# Patient Record
Sex: Male | Born: 1960
Health system: Southern US, Community
[De-identification: ages and names within clinical notes are randomized; demographics above are authoritative.]

## PROBLEM LIST (undated history)

## (undated) DIAGNOSIS — H9190 Unspecified hearing loss, unspecified ear: Secondary | ICD-10-CM

## (undated) DIAGNOSIS — N400 Enlarged prostate without lower urinary tract symptoms: Secondary | ICD-10-CM

## (undated) DIAGNOSIS — E7800 Pure hypercholesterolemia, unspecified: Secondary | ICD-10-CM

## (undated) HISTORY — DX: Pure hypercholesterolemia, unspecified: E78.00

---

## 2008-09-16 ENCOUNTER — Ambulatory Visit: Payer: Self-pay | Admitting: Cardiology

## 2008-11-18 ENCOUNTER — Ambulatory Visit: Payer: Self-pay | Admitting: Cardiology

## 2008-11-18 LAB — CONVERTED CEMR LAB
ALT: 17 units/L (ref 0–53)
Albumin: 3.8 g/dL (ref 3.5–5.2)
Alkaline Phosphatase: 52 units/L (ref 39–117)
CO2: 27 meq/L (ref 19–32)
Chloride: 110 meq/L (ref 96–112)
Glucose, Bld: 80 mg/dL (ref 70–99)
HDL: 26.6 mg/dL — ABNORMAL LOW (ref >39.00)
Potassium: 4.5 meq/L (ref 3.5–5.1)
Sodium: 143 meq/L (ref 135–145)
Total Protein: 6.4 g/dL (ref 6.0–8.3)

## 2009-01-03 ENCOUNTER — Ambulatory Visit: Payer: Self-pay | Admitting: Cardiology

## 2009-01-03 DIAGNOSIS — E7800 Pure hypercholesterolemia, unspecified: Secondary | ICD-10-CM | POA: Insufficient documentation

## 2009-01-10 ENCOUNTER — Telehealth: Payer: Self-pay | Admitting: Cardiology

## 2009-04-05 ENCOUNTER — Ambulatory Visit: Payer: Self-pay | Admitting: Cardiology

## 2009-04-06 LAB — CONVERTED CEMR LAB
ALT: 15 units/L (ref 0–53)
AST: 17 units/L (ref 0–37)
Albumin: 3.6 g/dL (ref 3.5–5.2)
Alkaline Phosphatase: 46 units/L (ref 39–117)
Bilirubin, Direct: 0.1 mg/dL (ref 0.0–0.3)
Total CHOL/HDL Ratio: 6
Total Protein: 6 g/dL (ref 6.0–8.3)

## 2009-04-07 ENCOUNTER — Telehealth: Payer: Self-pay | Admitting: Cardiology

## 2009-04-11 LAB — CONVERTED CEMR LAB
ALT: 25 units/L (ref 0–53)
Albumin: 4 g/dL (ref 3.5–5.2)
Cholesterol: 113 mg/dL (ref 0–200)
LDL Cholesterol: 75 mg/dL (ref 0–99)
Total Protein: 6.7 g/dL (ref 6.0–8.3)
Triglycerides: 42 mg/dL (ref 0.0–149.0)
VLDL: 8.4 mg/dL (ref 0.0–40.0)

## 2009-08-30 ENCOUNTER — Ambulatory Visit: Payer: Self-pay | Admitting: Cardiology

## 2009-08-31 LAB — CONVERTED CEMR LAB
Albumin: 3.7 g/dL (ref 3.5–5.2)
Alkaline Phosphatase: 47 units/L (ref 39–117)
Bilirubin, Direct: 0 mg/dL (ref 0.0–0.3)
HDL: 31.2 mg/dL — ABNORMAL LOW (ref >39.00)
Total CHOL/HDL Ratio: 3
Total Protein: 6.2 g/dL (ref 6.0–8.3)
Triglycerides: 44 mg/dL (ref 0.0–149.0)
VLDL: 8.8 mg/dL (ref 0.0–40.0)

## 2009-09-01 ENCOUNTER — Telehealth: Payer: Self-pay | Admitting: Cardiology

## 2009-11-20 ENCOUNTER — Telehealth: Payer: Self-pay | Admitting: Cardiology

## 2010-09-12 ENCOUNTER — Encounter: Payer: Self-pay | Admitting: Cardiology

## 2010-09-25 NOTE — Progress Notes (Signed)
Summary: returning call  Phone Note Call from Patient Call back at Work Phone [phone]   Caller: Patient Reason for Call: Talk to Nurse Summary of Call: returning call Initial call taken by: Migdalia Dk,  September 01, 2009 8:29 AM  Follow-up for Phone Call        PT HERE COPY OF LABS FIVEN AT PT"S REQUEST Follow-up by: Scherrie Bateman, LPN,  September 01, 2009 9:19 AM

## 2010-09-25 NOTE — Progress Notes (Signed)
Summary: pt needs a 90day supply w/ 1 refill  Phone Note Refill Request Call back at Work Phone [phone] Message from:  [facility] [place]  Refills Requested: Medication #1:  CRESTOR 20 MG TABS Take one tablet by mouth daily.. pt needs a 90day supply called in  Initial call taken by: Omer Jack,  November 20, 2009 2:35 PM  Follow-up for Phone Call        [facility] [place], [place] Follow-up by: Oswald Hillock,  November 21, 2009 10:06 AM    Prescriptions: CRESTOR 20 MG TABS (ROSUVASTATIN CALCIUM) Take one tablet by mouth daily.  #90 x 1   Entered by:   Oswald Hillock   Authorized by:   Gaylord Shih, MD, [facility]   Signed by:   Oswald Hillock on 11/21/2009   Method used:   [facility]  [place] [address]       [address]       [place], [place]  [address]       Ph: [phone] or [phone]       Fax: [phone]   RxID:   [id number]

## 2011-01-08 NOTE — Assessment & Plan Note (Signed)
[facility]                            CARDIOLOGY OFFICE NOTE   NAME:Wesley Wise                      MRN:          [id number]  DATE:09/16/2008                            DOB:          [birth year]    HISTORY OF PRESENT ILLNESS:  Wesley Wise is a 50 year old married  white male, husband of the patient of mine, who comes today because of  some concerns of having heart disease.   His risk factors include age greater than 26, tobacco use of  approximately 5 per day at present but over 30 years span, and a family  history of coronary artery disease.  His father had a heart attack at  age 76, but his brother had bypass at 31.   He does not know his lipid panel.  He is not hypertensive.  He does not  have diabetes.   His current exercise strategy allows him to do the bike and also some  other cardio activity without any chest discomfort or chest pressure.  He does get short of breath.  He denies orthopnea, PND, or peripheral  edema.  He has had no tachy palpitations.   PAST MEDICAL HISTORY:  He is not allergic to any medications.  He does  not use recreational drugs.  He rarely drinks alcohol.  He smokes about  5-10 cigarettes a day.  He does drink a lot of caffeine.   SURGICAL HISTORY:  None.   FAMILY HISTORY:  As above.   SOCIAL HISTORY:  He is married and has 3 children.  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  He works in [employer] and does a fair  amount of manual work and lifting.   REVIEW OF SYSTEMS:  All systems reviewed and are negative.  He  specifically denies any chest discomfort, difficulty swallowing, reflux,  abdominal pain, and no change in bowel habits.   PHYSICAL EXAMINATION:  GENERAL:  He is extremely pleasant gentleman.  VITAL SIGNS:  He is 6 feet tall and weighs 230.  His blood pressure is  105/73 and his pulse is 56 and regular.   His electrocardiogram is essentially normal, except for poor R-wave  progression across the anterior precordium.  He has a left axis  deviation.  HEENT:  He has a mustache.  Dentition, oral mucosa is normal.  Otherwise, normal HEENT.  NECK:  Supple.  Carotids upstrokes are equal bilaterally without bruits,  no JVD.  Thyroid is not enlarged.  Trachea is midline.  LUNGS:  Clear to auscultation and percussion.  HEART:  A nondisplaced PMI.  Normal S1 and S2.  ABDOMEN:  Soft, good bowel sounds.  He has got mild abdominal  protuberance.  There is no midline bruit.  No pulsatile mass.  EXTREMITIES:  No cyanosis, clubbing, or edema.  Pulses are intact.  NEURO:  Intact.  SKIN:  Unremarkable.  MUSCULOSKELETAL:  No significant joint deformities or effusions.   ASSESSMENT:  Mr. Wesley Wise has several cardiac risk factors for premature  coronary artery disease.  He does not know his lipid status.  He is  currently having  some dyspnea on exertion.   PLAN:  1. Exercise treadmill.  2. Fasting lipids and a comprehensive metabolic panel.  I would have a      low threshold for treating him with 81 mg of aspirin per day as      well as a statin.  We will review when he returns.      Thomas C. Daleen Squibb, MD, [facility]  Electronically Signed    TCW/MedQ  DD: 09/16/2008  DT: 09/16/2008  Job #: [id number]   cc:   Teena Irani. Arlyce Dice, M.D.

## 2011-05-10 ENCOUNTER — Other Ambulatory Visit: Payer: Self-pay | Admitting: Cardiology

## 2011-06-19 ENCOUNTER — Other Ambulatory Visit: Payer: Self-pay | Admitting: Family Medicine

## 2011-06-19 ENCOUNTER — Ambulatory Visit
Admission: RE | Admit: 2011-06-19 | Discharge: 2011-06-19 | Disposition: A | Discharge status: Home / Self Care | Payer: BC Managed Care – PPO | Source: Ambulatory Visit | Attending: Family Medicine | Admitting: Family Medicine

## 2011-06-19 DIAGNOSIS — R1909 Other intra-abdominal and pelvic swelling, mass and lump: Secondary | ICD-10-CM

## 2012-05-04 ENCOUNTER — Other Ambulatory Visit: Payer: Self-pay | Admitting: Cardiology

## 2012-05-05 NOTE — Telephone Encounter (Signed)
I spoke with Wesley Wise sch for Dr Wesley Wise she is going to make pt an ov. I spoke with pt he is in agreement and is awaiting a call. Give pt three refill so he can have time to get into the ov Wesley Wise

## 2012-07-27 ENCOUNTER — Encounter: Payer: Self-pay | Admitting: *Deleted

## 2012-07-29 ENCOUNTER — Ambulatory Visit (INDEPENDENT_AMBULATORY_CARE_PROVIDER_SITE_OTHER): Payer: BC Managed Care – PPO | Admitting: Cardiology

## 2012-07-29 ENCOUNTER — Encounter: Payer: Self-pay | Admitting: Cardiology

## 2012-07-29 VITALS — BP 123/78 | HR 64 | Ht 72.0 in | Wt 247.0 lb

## 2012-07-29 DIAGNOSIS — E7800 Pure hypercholesterolemia, unspecified: Secondary | ICD-10-CM

## 2012-07-29 MED ORDER — ROSUVASTATIN CALCIUM 20 MG PO TABS: 20.0000 mg | ORAL_TABLET | Freq: Every day | ORAL | Status: DC

## 2012-07-29 NOTE — Patient Instructions (Addendum)
Your Crestor has been refilled  Please make sure you have your cholesterol and lfts checked before Christmas at [facility].  Your physician recommends that you continue on your current medications as directed. Please refer to the Current Medication list given to you today.

## 2012-07-29 NOTE — Progress Notes (Signed)
HPI Wesley Wise returns today for evaluation and management of his history of hyperlipidemia.  He has not had his blood work checked in almost 2 years. Last was at [facility] with [facility]. We have not seen him in several years.  He says he has 3 more weeks of Crestor left!  He denies any symptoms of angina or ischemic heart vascular disease.  Past Medical History  Diagnosis Date  . Pure hypercholesterolemia     Current Outpatient Prescriptions  Medication Sig Dispense Refill  . aspirin 81 MG tablet Take 81 mg by mouth daily.      . Multiple Vitamin (MULTIVITAMIN) tablet Take 1 tablet by mouth daily.      . rosuvastatin (CRESTOR) 20 MG tablet Take 1 tablet (20 mg total) by mouth at bedtime.  90 tablet  3  . [DISCONTINUED] CRESTOR 20 MG tablet TAKE ONE TABLET BY MOUTH DAILY.  90 tablet  0    No Known Allergies  Family History  Problem Relation Age of Onset  . CAD      family history    History   Social History  . Marital Status: Married    Spouse Name: N/A    Number of Children: N/A  . Years of Education: N/A   Occupational History  . Not on file.   Social History Main Topics  . Smoking status: Former Games developer  . Smokeless tobacco: Never Used  . Alcohol Use: Yes  . Drug Use: No  . Sexually Active: Not on file   Other Topics Concern  . Not on file   Social History Narrative  . No narrative on file    ROS ALL NEGATIVE EXCEPT THOSE NOTED IN HPI  PE  General Appearance: well developed, well nourished in no acute distress, muscular HEENT: symmetrical face, PERRLA, good dentition  Neck: no JVD, thyromegaly, or adenopathy, trachea midline Chest: symmetric without deformity Cardiac: PMI non-displaced, RRR, normal S1, S2, no gallop or murmur Lung: clear to ausculation and percussion Vascular: all pulses full without bruits  Abdominal: nondistended, nontender, good bowel sounds, no HSM, no bruits Extremities: no cyanosis, clubbing or edema, no sign of DVT, no  varicosities  Skin: normal color, no rashes Neuro: alert and oriented x 3, non-focal Pysch: normal affect  EKG  Normal sinus rhythm, normal EKG  BMET    Component Value Date/Time   NA 143 11/18/2008 1157   K 4.5 11/18/2008 1157   CL 110 11/18/2008 1157   CO2 27 11/18/2008 1157   GLUCOSE 80 11/18/2008 1157   BUN 12 11/18/2008 1157   CREATININE 1.1 11/18/2008 1157   CALCIUM 9.0 11/18/2008 1157   GFRNONAA 76.03 11/18/2008 1157    Lipid Panel     Component Value Date/Time   CHOL 108 08/30/2009 0905   TRIG 44.0 08/30/2009 0905   HDL 31.20* 08/30/2009 0905   CHOLHDL 3 08/30/2009 0905   VLDL 8.8 08/30/2009 0905   LDLCALC 68 08/30/2009 0905    CBC No results found for this basename: wbc, rbc, hgb, hct, plt, mcv, mch, mchc, rdw, neutrabs, lymphsabs, monoabs, eosabs, basosabs

## 2012-07-29 NOTE — Assessment & Plan Note (Signed)
I've advised him to go to his primary care physician office in [place] and have  complete blood work done before Christmas. He then needs her regular physical. I have renewed his Crestor so he does not run out. I'll see him back on a when necessary basis.

## 2012-08-05 ENCOUNTER — Telehealth: Payer: Self-pay | Admitting: Cardiology

## 2012-08-05 NOTE — Telephone Encounter (Signed)
I spoke with pt wife about lab work. She stated he had appt with pcp for lab draw but they called and said he could not have it drawn b/c it was not ordered by their pcp.  OV note from Dr. Daleen Squibb faxed to pcp. Will call office and ask about pt having lab work.  Mylo Red RN

## 2012-08-05 NOTE — Telephone Encounter (Signed)
I had called pt pcp office and spoke with Debbie. She states pt has his DOT appointment tomorrow and they have him listed for fasting lab work. Pt called back to our office at the same time & is aware of fasting lab work for tomorrow at his pcp. Mylo Red RN

## 2012-08-05 NOTE — Telephone Encounter (Signed)
New problem:    Need an order for lab work drawn for tomorrow.

## 2013-08-11 ENCOUNTER — Other Ambulatory Visit: Payer: Self-pay | Admitting: Cardiology

## 2013-11-09 ENCOUNTER — Other Ambulatory Visit: Payer: Self-pay

## 2013-11-09 MED ORDER — ROSUVASTATIN CALCIUM 20 MG PO TABS: ORAL_TABLET | ORAL | Status: DC

## 2014-09-02 ENCOUNTER — Ambulatory Visit (INDEPENDENT_AMBULATORY_CARE_PROVIDER_SITE_OTHER): Payer: BC Managed Care – PPO | Admitting: Cardiology

## 2014-09-02 ENCOUNTER — Encounter: Payer: Self-pay | Admitting: Cardiology

## 2014-09-02 VITALS — BP 112/72 | HR 67 | Ht 72.0 in | Wt 249.8 lb

## 2014-09-02 DIAGNOSIS — E7800 Pure hypercholesterolemia, unspecified: Secondary | ICD-10-CM

## 2014-09-02 DIAGNOSIS — Z8249 Family history of ischemic heart disease and other diseases of the circulatory system: Secondary | ICD-10-CM

## 2014-09-02 LAB — COMPREHENSIVE METABOLIC PANEL
ALT: 32 U/L (ref 0–53)
AST: 22 U/L (ref 0–37)
Albumin: 4.3 g/dL (ref 3.5–5.2)
Alkaline Phosphatase: 53 U/L (ref 39–117)
BUN: 18 mg/dL (ref 6–23)
CO2: 24 mEq/L (ref 19–32)
Calcium: 9 mg/dL (ref 8.4–10.5)
Chloride: 108 mEq/L (ref 96–112)
Creatinine, Ser: 1 mg/dL (ref 0.4–1.5)
GFR: 83.88 mL/min (ref >60.00)
Glucose, Bld: 91 mg/dL (ref 70–99)
Potassium: 3.8 mEq/L (ref 3.5–5.1)
Sodium: 139 mEq/L (ref 135–145)
Total Bilirubin: 0.9 mg/dL (ref 0.2–1.2)
Total Protein: 7 g/dL (ref 6.0–8.3)

## 2014-09-02 NOTE — Progress Notes (Signed)
Patient ID: SRIKAR CHIANG, male   DOB: [birth year], 54 y.o.   MRN: [id number]    Patient Name: Wesley Wise Date of Encounter: 09/02/2014  Primary Care Provider:  No primary care provider on file. Primary Cardiologist:  Dorothy Spark  Problem List   Past Medical History  Diagnosis Date  . Pure hypercholesterolemia    No past surgical history on file.  Allergies  No Known Allergies  HPI  Wesley Wise is a pleasant 54 year old gentleman who was previously followed by Dr. Verl Blalock for hyperlipidemia and family history of premature coronary artery disease. Patient was previously diagnosed with high cholesterol and started on Crestor 10 mg daily that he was taking for a few years, however ran out and didn't have any refills, stopped in March 2015. Last cholesterol check we have in our system showed HDL 31, LDL 68 and triglycerides 44. This was while he wasl still taking his Crestor. Patient states that he is not exceptionally active but can certainly walk a few miles without any symptoms of chest pain, shortness of breath, palpitations or syncope. He also denies orthopnea, paroxysmal nocturnal dyspnea, claudication. He states that 3 of his brothers had myocardial infarction in their 24s and 60s as well as one of his sisters.   Home Medications  Prior to Admission medications   Medication Sig Start Date End Date Taking? Authorizing Provider  aspirin 81 MG tablet Take 81 mg by mouth daily.   Yes Historical Provider, MD  Multiple Vitamin (MULTIVITAMIN) tablet Take 1 tablet by mouth daily.   Yes Historical Provider, MD    Family History  Family History  Problem Relation Age of Onset  . CAD      family history    Social History  History   Social History  . Marital Status: Married    Spouse Name: N/A    Number of Children: N/A  . Years of Education: N/A   Occupational History  . Not on file.   Social History Main Topics  . Smoking status: Former Research scientist (life sciences)  . Smokeless  tobacco: Never Used  . Alcohol Use: Yes  . Drug Use: No  . Sexual Activity: Not on file   Other Topics Concern  . Not on file   Social History Narrative     Review of Systems, as per HPI, otherwise negative General:  No chills, fever, night sweats or weight changes.  Cardiovascular:  No chest pain, dyspnea on exertion, edema, orthopnea, palpitations, paroxysmal nocturnal dyspnea. Dermatological: No rash, lesions/masses Respiratory: No cough, dyspnea Urologic: No hematuria, dysuria Abdominal:   No nausea, vomiting, diarrhea, bright red blood per rectum, melena, or hematemesis Neurologic:  No visual changes, wkns, changes in mental status. All other systems reviewed and are otherwise negative except as noted above.  Physical Exam  Blood pressure 112/72, pulse 67, height 6' (1.829 m), weight 249 lb 12.8 oz (113.309 kg).  General: Pleasant, NAD Psych: Normal affect. Neuro: Alert and oriented X 3. Moves all extremities spontaneously. HEENT: Normal  Neck: Supple without bruits or JVD. Lungs:  Resp regular and unlabored, CTA. Heart: RRR no s3, s4, or murmurs. Abdomen: Soft, non-tender, non-distended, BS + x 4.  Extremities: No clubbing, cyanosis or edema. DP/PT/Radials 2+ and equal bilaterally.  Labs:  No results for input(s): CKTOTAL, CKMB, TROPONINI in the last 72 hours. No results found for: WBC, HGB, HCT, MCV, PLT  No results found for: DDIMER Invalid input(s): POCBNP    Component Value Date/Time   NA 143  11/18/2008 1157   K 4.5 11/18/2008 1157   CL 110 11/18/2008 1157   CO2 27 11/18/2008 1157   GLUCOSE 80 11/18/2008 1157   BUN 12 11/18/2008 1157   CREATININE 1.1 11/18/2008 1157   CALCIUM 9.0 11/18/2008 1157   PROT 6.2 08/30/2009 0905   ALBUMIN 3.7 08/30/2009 0905   AST 22 08/30/2009 0905   ALT 18 08/30/2009 0905   ALKPHOS 47 08/30/2009 0905   BILITOT 0.7 08/30/2009 0905   GFRNONAA 76.03 11/18/2008 1157   Lab Results  Component Value Date   CHOL 108 08/30/2009    HDL 31.20* 08/30/2009   LDLCALC 68 08/30/2009   TRIG 44.0 08/30/2009    Accessory Clinical Findings  Echocardiogram - none  ECG - SR, LAD, LAFB, otherwise normal, unchanged from 2013.   Assessment & Plan  54 year old gentleman  1. Hyperlipidemia and significant family history of premature coronary artery disease with some of his siblings having heart attack without any warning. Patient is an excellent candidate for risk stratification with calcium scoring. His last lipid profile was excellent but this was while he was taking Crestor. We will check his NMR lipid profile today including lipoprotein a to evaluate for particle size and number as well as hereditary portion of his hyperlipidemia. Calcium score will risk risk stratify patient and we can potentially take him off cholesterol medicine or intensify his regimen based on the results.  We'll call patient with the results and afterwards follow in one year.    Dorothy Spark, MD, [facility] 09/02/2014, 2:43 PM

## 2014-09-02 NOTE — Patient Instructions (Signed)
Your physician recommends that you continue on your current medications as directed. Please refer to the Current Medication list given to you today.  Your physician recommends that you return for lab work today (NMR lipid profile, Lipoprotein A, CMP)  Your physician would like for you to get a Calcium Score Study.  There is an out of pocket charge of $150.  Your physician wants you to follow-up in: 1 year with Dr. Meda Coffee. You will receive a reminder letter in the mail two months in advance. If you don't receive a letter, please call our office to schedule the follow-up appointment.

## 2014-09-04 LAB — NMR LIPOPROFILE WITH LIPIDS
Cholesterol, Total: 161 mg/dL (ref 100–199)
HDL Particle Number: 21.7 umol/L — ABNORMAL LOW (ref >=30.5)
HDL Size: 8.6 nm — ABNORMAL LOW (ref >=9.2)
HDL-C: 28 mg/dL — ABNORMAL LOW (ref >39)
LDL (calc): 97 mg/dL (ref 0–99)
LDL Particle Number: 1559 nmol/L — ABNORMAL HIGH (ref <1000)
LDL Size: 20.5 nm (ref >=20.8)
LP-IR Score: 92 — ABNORMAL HIGH (ref <=45)
Large HDL-P: 1.8 umol/L — ABNORMAL LOW (ref >=4.8)
Large VLDL-P: 3.4 nmol/L — ABNORMAL HIGH (ref <=2.7)
Small LDL Particle Number: 712 nmol/L — ABNORMAL HIGH (ref <=527)
Triglycerides: 182 mg/dL — ABNORMAL HIGH (ref 0–149)
VLDL Size: 64.8 nm — ABNORMAL HIGH (ref <=46.6)

## 2014-09-05 LAB — LIPOPROTEIN A (LPA): Lipoprotein (a): 14 mg/dL (ref 0–30)

## 2014-10-12 ENCOUNTER — Ambulatory Visit (INDEPENDENT_AMBULATORY_CARE_PROVIDER_SITE_OTHER)
Admission: RE | Admit: 2014-10-12 | Discharge: 2014-10-12 | Disposition: A | Discharge status: Home / Self Care | Payer: Self-pay | Source: Ambulatory Visit | Attending: Cardiology | Admitting: Cardiology

## 2014-10-12 DIAGNOSIS — E7800 Pure hypercholesterolemia, unspecified: Secondary | ICD-10-CM

## 2014-10-12 DIAGNOSIS — Z8249 Family history of ischemic heart disease and other diseases of the circulatory system: Secondary | ICD-10-CM

## 2014-10-14 ENCOUNTER — Telehealth: Payer: Self-pay | Admitting: *Deleted

## 2014-10-14 DIAGNOSIS — E785 Hyperlipidemia, unspecified: Secondary | ICD-10-CM

## 2014-10-14 DIAGNOSIS — R931 Abnormal findings on diagnostic imaging of heart and coronary circulation: Secondary | ICD-10-CM

## 2014-10-14 DIAGNOSIS — E7800 Pure hypercholesterolemia, unspecified: Secondary | ICD-10-CM

## 2014-10-14 MED ORDER — ROSUVASTATIN CALCIUM 10 MG PO TABS
10.0000 mg | ORAL_TABLET | Freq: Every day | ORAL | Status: DC
Start: 2014-10-14 — End: 2015-10-09

## 2014-10-14 NOTE — Telephone Encounter (Signed)
Informed the pt that per Dr Meda Coffee his lipids are elevated, and she recommends he start back taking Crestor 10 mg po daily and come in for a CMET in one month.  Informed the pt that per Dr Meda Coffee his calcium score is quite high for his age, showing evidence of atherosclerotic coronary disease, and she recommends restarting the Crestor and schedule an exercise treadmill test to make sure there are no flow limiting lesions.  Confirmed the pharmacy of choice with the pt.  Pt requesting his lab appt be made the same day as his exercise myoview.  Informed the pt that someone from our scheduling dept will be contacting him to have both of these appts set up.  Informed the pt to inform the scheduler that he request both appts to be scheduled same day.  Pt verbalized understanding and agrees with this plan.

## 2014-10-14 NOTE — Telephone Encounter (Signed)
-----   Message from Dorothy Spark, MD sent at 10/13/2014  3:05 PM EST ----- Lipids are elevated, I would restart crestor 10 mg po daily and follow CMP in 1 month. Also his calcium score is quite high for his age, showing evidence of atherosclerotic coronary disease, therefore we need to restart crestor and schedule an exercise treadmill stress test to make sure there are no flow limiting lesions.

## 2014-10-28 ENCOUNTER — Encounter: Payer: Self-pay | Admitting: Cardiology

## 2014-11-16 ENCOUNTER — Ambulatory Visit (HOSPITAL_COMMUNITY): Payer: BLUE CROSS/BLUE SHIELD | Attending: Cardiology | Admitting: Radiology

## 2014-11-16 DIAGNOSIS — E785 Hyperlipidemia, unspecified: Secondary | ICD-10-CM | POA: Insufficient documentation

## 2014-11-16 DIAGNOSIS — E7800 Pure hypercholesterolemia, unspecified: Secondary | ICD-10-CM

## 2014-11-16 DIAGNOSIS — I2510 Atherosclerotic heart disease of native coronary artery without angina pectoris: Secondary | ICD-10-CM | POA: Insufficient documentation

## 2014-11-16 DIAGNOSIS — R931 Abnormal findings on diagnostic imaging of heart and coronary circulation: Secondary | ICD-10-CM

## 2014-11-16 MED ORDER — TECHNETIUM TC 99M SESTAMIBI GENERIC - CARDIOLITE
33.0000 | Freq: Once | INTRAVENOUS | Status: AC | PRN
  Administered 2014-11-16: 33 via INTRAVENOUS

## 2014-11-16 MED ORDER — TECHNETIUM TC 99M SESTAMIBI GENERIC - CARDIOLITE
11.0000 | Freq: Once | INTRAVENOUS | Status: AC | PRN
  Administered 2014-11-16: 11 via INTRAVENOUS

## 2014-11-16 NOTE — Progress Notes (Signed)
  [facility] [address] [place], [place] [address] [phone]    Cardiology Nuclear Med Study  Wesley Wise is a 54 y.o. male     MRN : [id number]     DOB: 09-Jul-1961  Procedure Date: 11/16/2014  Nuclear Med Background Indication for Stress Test:  Evaluation for Ischemia and CT scan: elevated coronary calcium score History:  No known CAD Cardiac Risk Factors: none  Symptoms:  None indicated   Nuclear Pre-Procedure Caffeine/Decaff Intake:  None NPO After: 7:00pm   Lungs:  clear O2 Sat: 96% on room air. IV 0.9% NS with Angio Cath:  22g  IV Site: R Hand  IV Started by:  Matilde Haymaker, RN  Chest Size (in):  44 Cup Size: n/a  Height: 6' (1.829 m)  Weight:  234 lb (106.142 kg)  BMI:  Body mass index is 31.73 kg/(m^2). Tech Comments:  n/a    Nuclear Med Study 1 or 2 day study: 1 day  Stress Test Type:  Stress  Reading MD: n/a  Order Authorizing Provider:  Filiberto Pinks  Resting Radionuclide: Technetium 59m Sestamibi  Resting Radionuclide Dose: 11.0 mCi   Stress Radionuclide:  Technetium 66m Sestamibi  Stress Radionuclide Dose: 33.0 mCi           Stress Protocol Rest HR: 56 Stress HR: 151  Rest BP: 106/72 Stress BP: 162/46  Exercise Time (min): 10:45 METS: 12.9   Predicted Max HR: 167 bpm % Max HR: 90.42 bpm Rate Pressure Product: 24462   Dose of Adenosine (mg):  n/a Dose of Lexiscan: n/a mg  Dose of Atropine (mg): n/a Dose of Dobutamine: n/a mcg/kg/min (at max HR)  Stress Test Technologist: Glade Lloyd, BS-ES  Nuclear Technologist:  Earl Many, CNMT     Rest Procedure:  Myocardial perfusion imaging was performed at rest 45 minutes following the intravenous administration of Technetium 20m Sestamibi. Rest ECG: NSR with non-specific ST-T wave changes. T-wave inversion noted in aVR.  Stress Procedure:  The patient exercised on the treadmill utilizing the Bruce Protocol for 10:45 minutes. The patient stopped due to  fatigue and denied any chest pain.  Technetium 7m Sestamibi was injected at peak exercise and myocardial perfusion imaging was performed after a brief delay. Stress ECG: No significant change from baseline ECG  QPS Raw Data Images:  Normal; no motion artifact; normal heart/lung ratio. Stress Images:  Normal homogeneous uptake in all areas of the myocardium. apical thinning noted at both rest and stress. Rest Images:  Normal homogeneous uptake in all areas of the myocardium. Subtraction (SDS):  No evidence of ischemia. Transient Ischemic Dilatation (Normal <1.22):  0.84 Lung/Heart Ratio (Normal <0.45):  0.33  Quantitative Gated Spect Images QGS EDV:  116 ml QGS ESV:  49 ml  Impression Exercise Capacity:  Good exercise capacity. BP Response:  Normal blood pressure response. Clinical Symptoms:  No significant symptoms noted. ECG Impression:  No significant ST segment change suggestive of ischemia. Comparison with Prior Nuclear Study: No previous nuclear study performed  Overall Impression:  Normal stress nuclear study. Low risk study with no evidence of ischemia. Good exercise tolerance, 10 minutes and 45 seconds. No EKG changes.  LV Ejection Fraction: 58%.  LV Wall Motion:  NL LV Function; NL Wall Motion  Candee Furbish, MD

## 2014-11-24 ENCOUNTER — Telehealth: Payer: Self-pay | Admitting: Cardiology

## 2014-11-24 NOTE — Telephone Encounter (Signed)
Contacted the pt back to inform him that per Dr Meda Coffee his nuclear stress test was normal.  Pt verbalized understanding.

## 2014-11-24 NOTE — Telephone Encounter (Signed)
New message ° ° ° ° °Want test results °

## 2015-05-06 IMAGING — CT CT HEART SCORING
1 of 3 series · 10 of 20 positions shown, 13 images · non-contrast
Comparison: No priors.

CLINICAL DATA: Risk stratification

EXAM:
Coronary Calcium Score
TECHNIQUE: The patient was scanned on a Siemens Sensation 16 slice scanner.
Axial non-contrast 3mm slices were carried out through the heart.
The data set was analyzed on a dedicated work station and scored
using the Agatson method.

[Series 6: st thins for reformat · axial · 0.80mm/px · z∈[-226,-137]mm · 10 of 109 slices shown, 13 images]
[im 10/109  vessel]
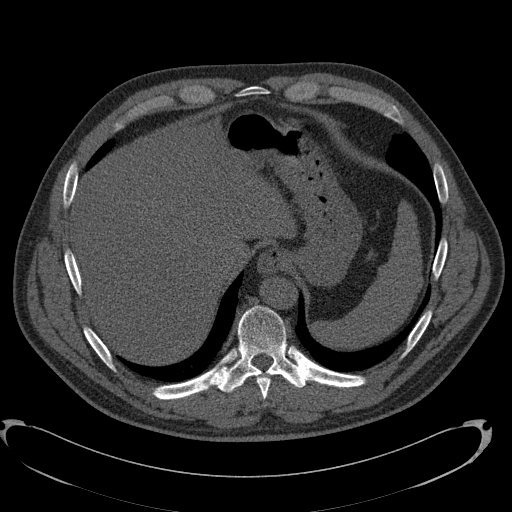
[im 10/109  lung]
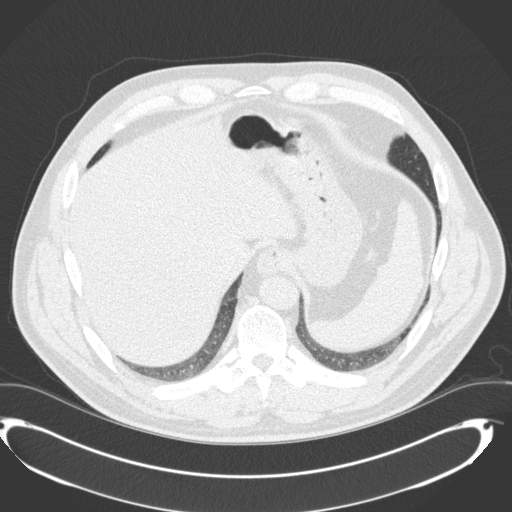
[im 20/109  vessel]
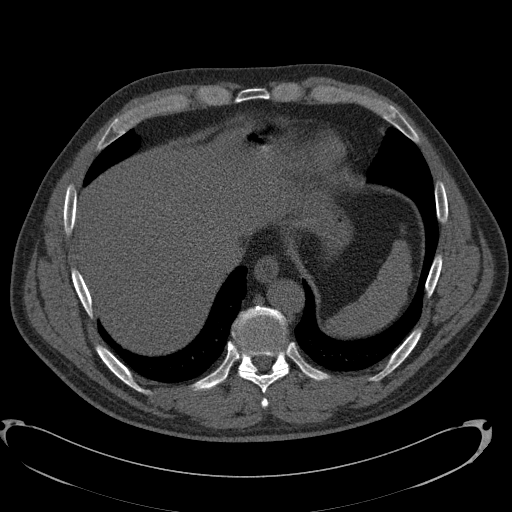
[im 30/109  vessel]
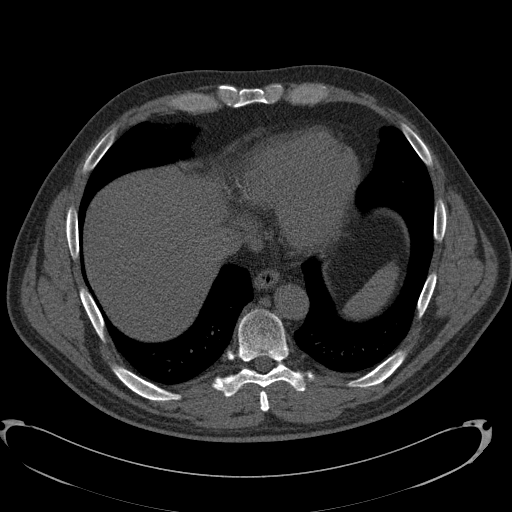
[im 40/109  vessel]
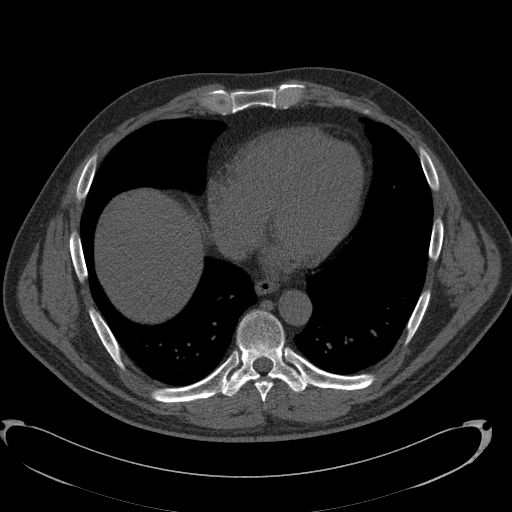
[im 50/109  vessel]
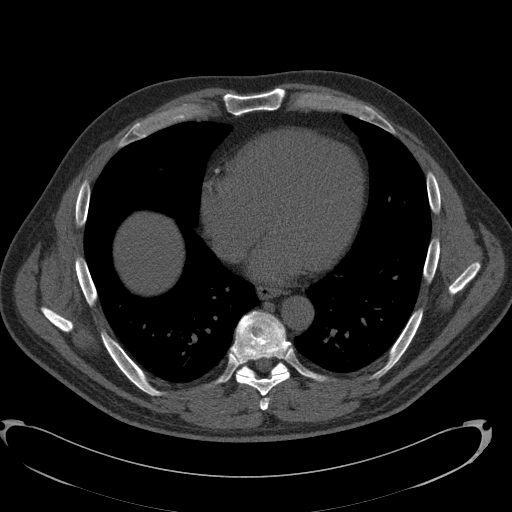
[im 50/109  lung]
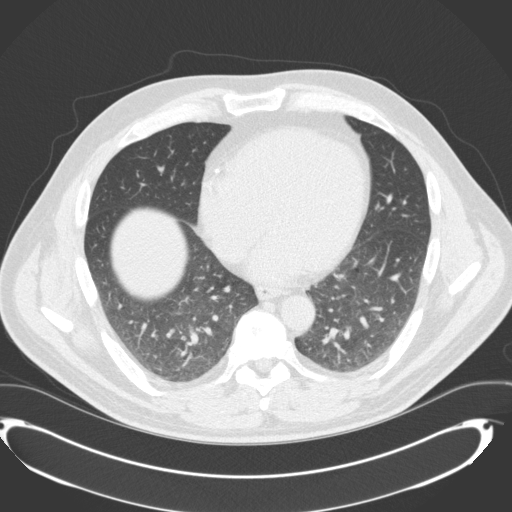
[im 59/109  vessel]
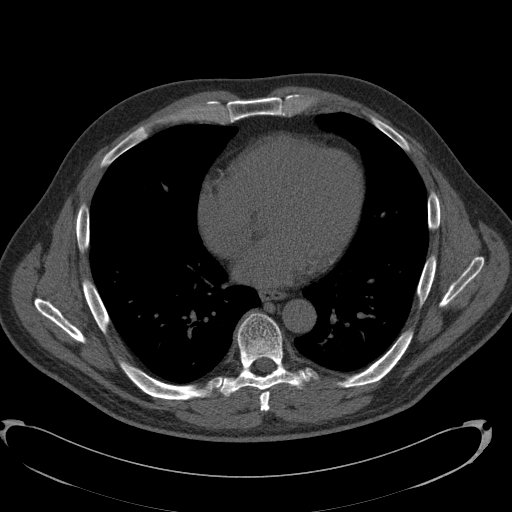
[im 69/109  vessel]
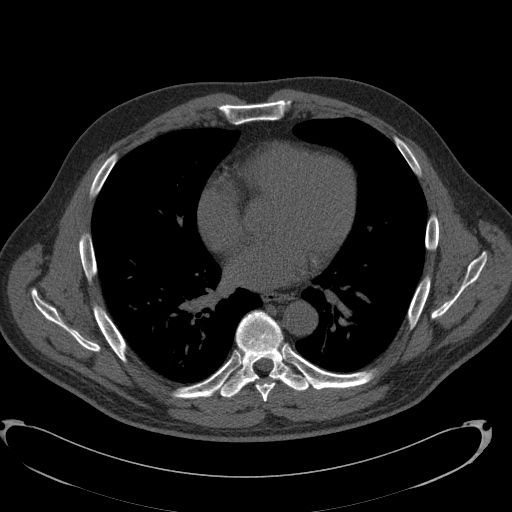
[im 79/109  vessel]
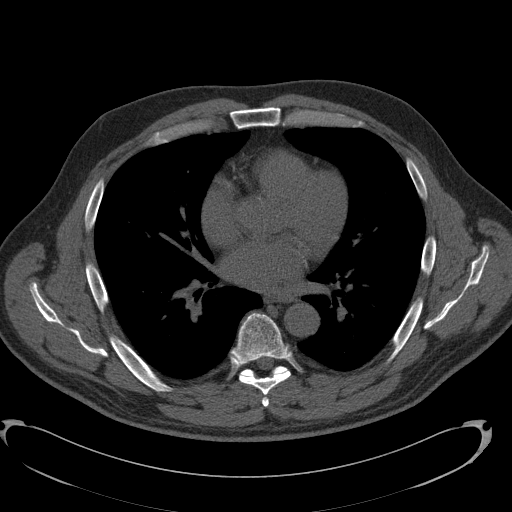
[im 89/109  vessel]
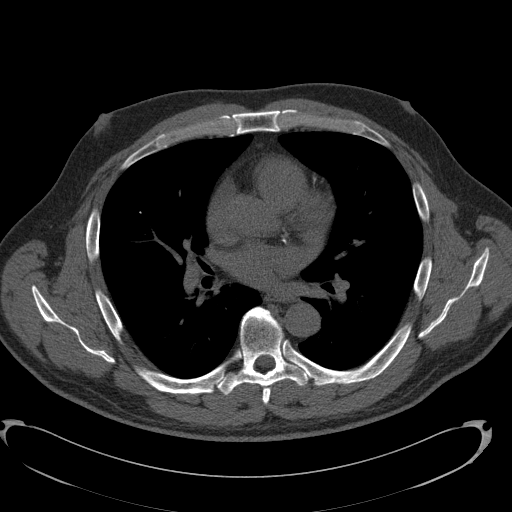
[im 89/109  lung]
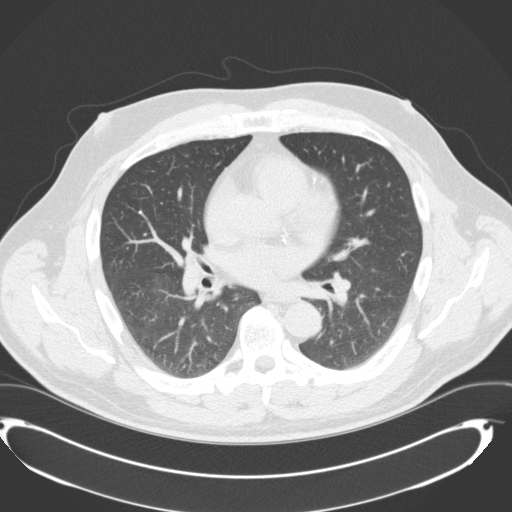
[im 99/109  vessel]
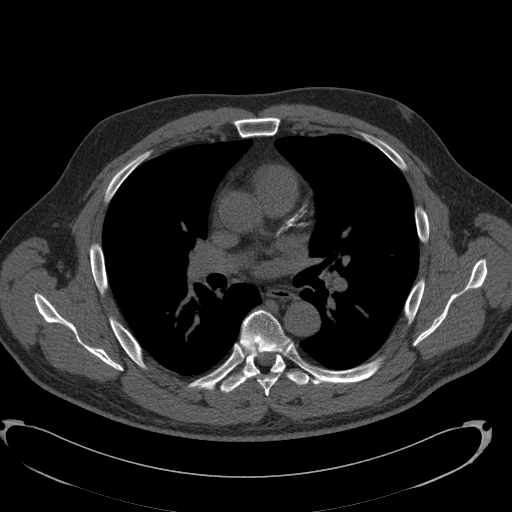

[10 of 20 positions shown; findings below may reference images not displayed]

FINDINGS: Non-cardiac: No significant non cardiac findings on limited lung and
soft tissue windows. See separate report from [REDACTED].

Ascending Aorta:  Normal caliber.

Pericardium: Normal

Coronary arteries:  Normal origin.
IMPRESSION: Coronary calcium score of 171. This was 86 percentile for age and
sex matched control.

Samrit Barvik

EXAM:
OVER-READ INTERPRETATION  CT CHEST

The following report is an over-read performed by radiologist Dr.
over-read does not include interpretation of cardiac or coronary
anatomy or pathology. The calcium score interpretation by the
cardiologist is attached.
FINDINGS: Within the visualized portions of the thorax there are no suspicious
appearing pulmonary nodules or masses, there is no acute
consolidative airspace disease, no pleural effusion, no pneumothorax
and no mediastinal or hilar lymphadenopathy. Visualized portions of
the upper abdomen are unremarkable. There are no aggressive
appearing lytic or blastic lesions noted in the visualized portions
of the skeleton.
IMPRESSION: 1. No significant incidental noncardiac findings noted.

## 2015-08-14 ENCOUNTER — Ambulatory Visit: Payer: BLUE CROSS/BLUE SHIELD | Admitting: Cardiology

## 2015-09-06 ENCOUNTER — Ambulatory Visit: Payer: BLUE CROSS/BLUE SHIELD | Admitting: Cardiology

## 2015-09-06 ENCOUNTER — Other Ambulatory Visit (INDEPENDENT_AMBULATORY_CARE_PROVIDER_SITE_OTHER): Payer: BLUE CROSS/BLUE SHIELD

## 2015-09-06 DIAGNOSIS — I2510 Atherosclerotic heart disease of native coronary artery without angina pectoris: Secondary | ICD-10-CM | POA: Diagnosis not present

## 2015-09-06 DIAGNOSIS — E785 Hyperlipidemia, unspecified: Secondary | ICD-10-CM | POA: Diagnosis not present

## 2015-09-06 DIAGNOSIS — E7800 Pure hypercholesterolemia, unspecified: Secondary | ICD-10-CM | POA: Diagnosis not present

## 2015-10-09 ENCOUNTER — Other Ambulatory Visit: Payer: Self-pay | Admitting: Cardiology

## 2015-10-25 ENCOUNTER — Encounter: Payer: Self-pay | Admitting: *Deleted

## 2015-10-30 ENCOUNTER — Other Ambulatory Visit: Payer: BLUE CROSS/BLUE SHIELD

## 2015-10-30 ENCOUNTER — Other Ambulatory Visit (INDEPENDENT_AMBULATORY_CARE_PROVIDER_SITE_OTHER): Payer: BLUE CROSS/BLUE SHIELD | Admitting: *Deleted

## 2015-10-30 ENCOUNTER — Ambulatory Visit (INDEPENDENT_AMBULATORY_CARE_PROVIDER_SITE_OTHER): Payer: BLUE CROSS/BLUE SHIELD | Admitting: Cardiology

## 2015-10-30 ENCOUNTER — Encounter: Payer: Self-pay | Admitting: Cardiology

## 2015-10-30 VITALS — BP 122/70 | HR 67 | Ht 72.0 in | Wt 243.0 lb

## 2015-10-30 DIAGNOSIS — E7800 Pure hypercholesterolemia, unspecified: Secondary | ICD-10-CM | POA: Diagnosis not present

## 2015-10-30 DIAGNOSIS — N529 Male erectile dysfunction, unspecified: Secondary | ICD-10-CM

## 2015-10-30 LAB — COMPREHENSIVE METABOLIC PANEL
ALT: 23 U/L (ref 9–46)
AST: 22 U/L (ref 10–35)
Albumin: 4.3 g/dL (ref 3.6–5.1)
Alkaline Phosphatase: 50 U/L (ref 40–115)
BUN: 16 mg/dL (ref 7–25)
CO2: 23 mmol/L (ref 20–31)
Calcium: 9.2 mg/dL (ref 8.6–10.3)
Chloride: 106 mmol/L (ref 98–110)
Creat: 1.02 mg/dL (ref 0.70–1.33)
Glucose, Bld: 103 mg/dL — ABNORMAL HIGH (ref 65–99)
Potassium: 4.5 mmol/L (ref 3.5–5.3)
Sodium: 139 mmol/L (ref 135–146)
Total Bilirubin: 0.8 mg/dL (ref 0.2–1.2)
Total Protein: 6.5 g/dL (ref 6.1–8.1)

## 2015-10-30 LAB — LIPID PANEL
Cholesterol: 127 mg/dL (ref 125–200)
HDL: 34 mg/dL — ABNORMAL LOW (ref >=40)
LDL Cholesterol: 80 mg/dL (ref <130)
Total CHOL/HDL Ratio: 3.7 Ratio (ref <=5.0)
Triglycerides: 65 mg/dL (ref <150)
VLDL: 13 mg/dL (ref <30)

## 2015-10-30 MED ORDER — SILDENAFIL CITRATE 50 MG PO TABS: 50.0000 mg | ORAL_TABLET | Freq: Every day | ORAL | Status: DC | PRN

## 2015-10-30 NOTE — Patient Instructions (Signed)
Medication Instructions:   DR Meda Coffee HAS PRESCRIBED YOU VIAGRA 50 MG BY MOUTH DAILY AS NEEDED FOR ERECTILE DYSFUNCTION   Labwork:  TODAY--CMET AND LIPIDS    Follow-Up:  Your physician wants you to follow-up in: [place] will receive a reminder letter in the mail two months in advance. If you don't receive a letter, please call our office to schedule the follow-up appointment.      If you need a refill on your cardiac medications before your next appointment, please call your pharmacy.

## 2015-10-30 NOTE — Progress Notes (Signed)
Patient ID: Wesley Wise, male   DOB: [birth year], 55 y.o.   MRN: [id number]    Patient Name: Wesley Wise Date of Encounter: 10/30/2015  Primary Care Provider:  Tula Nakayama Primary Cardiologist:  Dorothy Spark  Problem List   Past Medical History  Diagnosis Date  . Pure hypercholesterolemia    No past surgical history on file.  Allergies  No Known Allergies  HPI  [name] is a pleasant 55 year old gentleman who was previously followed by Dr. Verl Blalock for hyperlipidemia and family history of premature coronary artery disease. Patient was previously diagnosed with high cholesterol and started on Crestor 10 mg daily that he was taking for a few years, however ran out and didn't have any refills, stopped in March 2015. Last cholesterol check we have in our system showed HDL 31, LDL 68 and triglycerides 44. This was while he wasl still taking his Crestor. Patient states that he is not exceptionally active but can certainly walk a few miles without any symptoms of chest pain, shortness of breath, palpitations or syncope. He also denies orthopnea, paroxysmal nocturnal dyspnea, claudication. He states that 3 of his brothers had myocardial infarction in their 7s and 61s as well as one of his sisters.   10/31/2015 - 1 year follow-up, patient is feeling great, last year he underwent calcium scoring it was 171, an exercise nuclear stress test was ordered and it was negative for prior infarct or ischemia. His lipid profile showed elevated triglycerides and he was started on rosuvastatin 10 mg daily that he takes with no side effects. He denies any chest pain or shortness of breath however in the last year his job has changed is now more sedentary.  Home Medications  Prior to Admission medications   Medication Sig Start Date End Date Taking? Authorizing Provider  aspirin 81 MG tablet Take 81 mg by mouth daily.   Yes Historical Provider, MD  Multiple Vitamin (MULTIVITAMIN) tablet Take  1 tablet by mouth daily.   Yes Historical Provider, MD    Family History  Family History  Problem Relation Age of Onset  . CAD Brother   . Heart attack Father 67  . Heart Problems Brother 46    CABG    Social History  Social History   Social History  . Marital Status: Married    Spouse Name: N/A  . Number of Children: N/A  . Years of Education: N/A   Occupational History  . Not on file.   Social History Main Topics  . Smoking status: Former Research scientist (life sciences)  . Smokeless tobacco: Never Used  . Alcohol Use: Yes  . Drug Use: No  . Sexual Activity: Not on file   Other Topics Concern  . Not on file   Social History Narrative     Review of Systems, as per HPI, otherwise negative General:  No chills, fever, night sweats or weight changes.  Cardiovascular:  No chest pain, dyspnea on exertion, edema, orthopnea, palpitations, paroxysmal nocturnal dyspnea. Dermatological: No rash, lesions/masses Respiratory: No cough, dyspnea Urologic: No hematuria, dysuria Abdominal:   No nausea, vomiting, diarrhea, bright red blood per rectum, melena, or hematemesis Neurologic:  No visual changes, wkns, changes in mental status. All other systems reviewed and are otherwise negative except as noted above.  Physical Exam  Blood pressure 122/70, pulse 67, height 6' (1.829 m), weight 243 lb (110.224 kg).  General: Pleasant, NAD Psych: Normal affect. Neuro: Alert and oriented X 3. Moves all extremities spontaneously. HEENT: Normal  Neck: Supple without bruits or JVD. Lungs:  Resp regular and unlabored, CTA. Heart: RRR no s3, s4, or murmurs. Abdomen: Soft, non-tender, non-distended, BS + x 4.  Extremities: No clubbing, cyanosis or edema. DP/PT/Radials 2+ and equal bilaterally.  Labs:  No results for input(s): CKTOTAL, CKMB, TROPONINI in the last 72 hours. No results found for: WBC, HGB, HCT, MCV, PLT  No results found for: DDIMER Invalid input(s): POCBNP    Component Value Date/Time   NA  139 09/02/2014 1541   K 3.8 09/02/2014 1541   CL 108 09/02/2014 1541   CO2 24 09/02/2014 1541   GLUCOSE 91 09/02/2014 1541   BUN 18 09/02/2014 1541   CREATININE 1.0 09/02/2014 1541   CALCIUM 9.0 09/02/2014 1541   PROT 7.0 09/02/2014 1541   ALBUMIN 4.3 09/02/2014 1541   AST 22 09/02/2014 1541   ALT 32 09/02/2014 1541   ALKPHOS 53 09/02/2014 1541   BILITOT 0.9 09/02/2014 1541   GFRNONAA 76.03 11/18/2008 1157   Lab Results  Component Value Date   CHOL 161 09/02/2014   HDL 28* 09/02/2014   LDLCALC 97 09/02/2014   TRIG 182* 09/02/2014   Accessory Clinical Findings  Echocardiogram - none  ECG - SR, LAD, LAFB, otherwise normal, unchanged from 2013.  Calcium score: 09/2014 IMPRESSION: Coronary calcium score of 171. This was 59 percentile for age and sex matched control.  Ena Dawley     Assessment & Plan  55 year old gentleman  1. Hyperlipidemia and significant family history of premature coronary artery disease with some of his siblings having heart attack without any warning.  His EKG today is unchanged, we will continue rosuvastatin 10 mg daily as a primary prevention with his family history and elevated calcium score. No ischemic workup needed at this time, he is advised to increase his level of exercise. Recheck lipids and CMP today.  2. Erectile dysfunction - we'll prescribe Viagra 50 mg daily as needed.   We'll call patient with the results and afterwards follow in one year.    Dorothy Spark, MD, [facility] 10/30/2015, 8:37 AM

## 2015-11-15 ENCOUNTER — Telehealth: Payer: Self-pay | Admitting: Cardiology

## 2015-11-15 MED ORDER — SILDENAFIL CITRATE 20 MG PO TABS: 20.0000 mg | ORAL_TABLET | Freq: Every day | ORAL | Status: DC

## 2015-11-15 NOTE — Telephone Encounter (Signed)
I am ok with 20 mg of Viagra

## 2015-11-15 NOTE — Telephone Encounter (Signed)
Dr Meda Coffee originally prescribed this pt Viagra 50 mg to take PRN and per the pt he went to fill this and the Pharmacist at [facility] informed him that his current insurance will not cover Viagra 50 mg but will cover Viagra 20 mg.  Pt will need an order to be given from Dr Meda Coffee to have Viagra 50 mg d/c'ed and prescribe Viagra 20 mg in its place for insurance to cover.  Will route this message to Dr Meda Coffee to obtain an order to switch the dose of this medication and follow-up with the pt and [facility] thereafter.

## 2015-11-15 NOTE — Telephone Encounter (Signed)
Dr Meda Coffee has provided a verbal order over the phone for the pt to take Sildenafil 20 mg po daily as needed for erectile dysfunction.  Switching this from Viagra to Sildenafil makes the cost of this drug more effective with pts insurance plan and using a 40 % off coupon from [facility] per the pt.  Informed the pt that he must come and pick this new prescription up, after Dr Meda Coffee signs this.  Informed the pt that she will be back in the office in the morning.  Informed the pt that he has to take this prescription along with his coupon from [facility], and submit this to [facility] for refill at hopefully a cheaper cost.  Pt verbalized understanding and agrees with this plan.

## 2015-11-15 NOTE — Telephone Encounter (Signed)
Please switch to 20 mg

## 2015-11-15 NOTE — Telephone Encounter (Signed)
New message      Calling to get a new presc sent to [facility] for Viagra 20mg .  A 50mg  tablet was sent to the pharmacy but patient's ins will not pay for 50mg .  Please call pt when this has been called in

## 2015-11-15 NOTE — Telephone Encounter (Signed)
Spoke with the pharmacist at [facility] in regards to changing the pts Viagra from 50 mg to 20 mg.  Per the pharmacist, there is no Viagra 20 mg po daily, and if your ordered straight Viagra 25 mg, this will cost the pt more money.  Pharmacist informed me that they instructed the pt to call Dr Meda Coffee back, and request for the order of sildenafil 20 mg po daily, for this is much cheaper.  Per the pharmacist, they are requiring for Dr Meda Coffee to advise that its ok to switch the pts Viagra 50 mg to Sildenafil 20 mg po daily, being they have different components, and have a different cost. Informed the pharmacist that I will need to route this message back to her to receive the clearance to switch the pts medication to Sildenafil 20 mg versus regular Viagra.  Pharmacist verbalized understanding and agrees with this plan.

## 2015-11-16 NOTE — Telephone Encounter (Signed)
Notified the pt that his prescription is available for him to pick up at the front desk at his earliest convenience.  Pt verbalized understanding, gracious for all the assistance provided, and states he will pick this up this afternoon.

## 2016-04-22 ENCOUNTER — Other Ambulatory Visit: Payer: Self-pay | Admitting: Cardiology

## 2016-08-09 ENCOUNTER — Other Ambulatory Visit: Payer: Self-pay | Admitting: Urology

## 2016-08-20 ENCOUNTER — Encounter (HOSPITAL_BASED_OUTPATIENT_CLINIC_OR_DEPARTMENT_OTHER): Payer: Self-pay | Admitting: *Deleted

## 2016-08-20 NOTE — Progress Notes (Signed)
Pt instructed npo pmn 12/28.  To [facility] 12/29 @ 0715.  Needs hgb on arrival.ekg w chart.

## 2016-08-23 ENCOUNTER — Encounter (HOSPITAL_BASED_OUTPATIENT_CLINIC_OR_DEPARTMENT_OTHER): Payer: Self-pay | Admitting: Certified Registered"

## 2016-08-23 ENCOUNTER — Ambulatory Visit (HOSPITAL_BASED_OUTPATIENT_CLINIC_OR_DEPARTMENT_OTHER): Payer: BLUE CROSS/BLUE SHIELD | Admitting: Anesthesiology

## 2016-08-23 ENCOUNTER — Ambulatory Visit (HOSPITAL_BASED_OUTPATIENT_CLINIC_OR_DEPARTMENT_OTHER)
Admission: RE | Admit: 2016-08-23 | Discharge: 2016-08-23 | Disposition: A | Discharge status: Home / Self Care | Payer: BLUE CROSS/BLUE SHIELD | Source: Ambulatory Visit | Attending: Urology | Admitting: Urology

## 2016-08-23 ENCOUNTER — Encounter (HOSPITAL_BASED_OUTPATIENT_CLINIC_OR_DEPARTMENT_OTHER)
Admission: RE | Disposition: A | Discharge status: Home / Self Care | Payer: Self-pay | Source: Ambulatory Visit | Attending: Urology

## 2016-08-23 DIAGNOSIS — N401 Enlarged prostate with lower urinary tract symptoms: Secondary | ICD-10-CM | POA: Diagnosis not present

## 2016-08-23 DIAGNOSIS — Z7982 Long term (current) use of aspirin: Secondary | ICD-10-CM | POA: Diagnosis not present

## 2016-08-23 DIAGNOSIS — N138 Other obstructive and reflux uropathy: Secondary | ICD-10-CM | POA: Insufficient documentation

## 2016-08-23 DIAGNOSIS — N529 Male erectile dysfunction, unspecified: Secondary | ICD-10-CM | POA: Diagnosis not present

## 2016-08-23 DIAGNOSIS — Z79899 Other long term (current) drug therapy: Secondary | ICD-10-CM | POA: Diagnosis not present

## 2016-08-23 DIAGNOSIS — Z87891 Personal history of nicotine dependence: Secondary | ICD-10-CM | POA: Insufficient documentation

## 2016-08-23 HISTORY — DX: Benign prostatic hyperplasia without lower urinary tract symptoms: N40.0

## 2016-08-23 HISTORY — PX: TRANSURETHRAL RESECTION OF PROSTATE: SHX73

## 2016-08-23 HISTORY — DX: Unspecified hearing loss, unspecified ear: H91.90

## 2016-08-23 LAB — POCT HEMOGLOBIN-HEMACUE: Hemoglobin: 16.2 g/dL (ref 13.0–17.0)

## 2016-08-23 SURGERY — TURP (TRANSURETHRAL RESECTION OF PROSTATE)
Anesthesia: General | Site: Prostate

## 2016-08-23 MED ORDER — ONDANSETRON HCL 4 MG/2ML IJ SOLN
INTRAMUSCULAR | Status: AC
  Filled 2016-08-23: qty 2

## 2016-08-23 MED ORDER — DEXAMETHASONE SODIUM PHOSPHATE 10 MG/ML IJ SOLN
INTRAMUSCULAR | Status: AC
  Filled 2016-08-23: qty 1

## 2016-08-23 MED ORDER — PROPOFOL 10 MG/ML IV BOLUS
INTRAVENOUS | Status: AC
  Filled 2016-08-23: qty 20

## 2016-08-23 MED ORDER — ONDANSETRON HCL 4 MG/2ML IJ SOLN
INTRAMUSCULAR | Status: DC | PRN
  Administered 2016-08-23: 4 mg via INTRAVENOUS

## 2016-08-23 MED ORDER — FENTANYL CITRATE (PF) 100 MCG/2ML IJ SOLN
INTRAMUSCULAR | Status: AC
  Filled 2016-08-23: qty 2

## 2016-08-23 MED ORDER — CIPROFLOXACIN IN D5W 400 MG/200ML IV SOLN
INTRAVENOUS | Status: AC
  Filled 2016-08-23: qty 200

## 2016-08-23 MED ORDER — MIDAZOLAM HCL 2 MG/2ML IJ SOLN
INTRAMUSCULAR | Status: AC
  Filled 2016-08-23: qty 2

## 2016-08-23 MED ORDER — PHENAZOPYRIDINE HCL 200 MG PO TABS: 200.0000 mg | ORAL_TABLET | Freq: Three times a day (TID) | ORAL | 0 refills | Status: DC | PRN

## 2016-08-23 MED ORDER — SODIUM CHLORIDE 0.9 % IR SOLN
Status: DC | PRN
  Administered 2016-08-23: 1 via INTRAVESICAL

## 2016-08-23 MED ORDER — PROPOFOL 10 MG/ML IV BOLUS
INTRAVENOUS | Status: DC | PRN
  Administered 2016-08-23: 200 mg via INTRAVENOUS

## 2016-08-23 MED ORDER — FENTANYL CITRATE (PF) 100 MCG/2ML IJ SOLN
INTRAMUSCULAR | Status: DC | PRN
  Administered 2016-08-23 (×2): 25 ug via INTRAVENOUS
  Administered 2016-08-23: 50 ug via INTRAVENOUS

## 2016-08-23 MED ORDER — CIPROFLOXACIN IN D5W 400 MG/200ML IV SOLN
400.0000 mg | INTRAVENOUS | Status: AC
  Administered 2016-08-23: 400 mg via INTRAVENOUS
  Filled 2016-08-23: qty 200

## 2016-08-23 MED ORDER — DEXAMETHASONE SODIUM PHOSPHATE 4 MG/ML IJ SOLN
INTRAMUSCULAR | Status: DC | PRN
  Administered 2016-08-23: 10 mg via INTRAVENOUS

## 2016-08-23 MED ORDER — LIDOCAINE 2% (20 MG/ML) 5 ML SYRINGE
INTRAMUSCULAR | Status: AC
  Filled 2016-08-23: qty 5

## 2016-08-23 MED ORDER — LIDOCAINE HCL 2 % EX GEL
CUTANEOUS | Status: AC
  Filled 2016-08-23: qty 5

## 2016-08-23 MED ORDER — LIDOCAINE 2% (20 MG/ML) 5 ML SYRINGE
INTRAMUSCULAR | Status: DC | PRN
  Administered 2016-08-23: 100 mg via INTRAVENOUS

## 2016-08-23 MED ORDER — HYDROCODONE-ACETAMINOPHEN 10-325 MG PO TABS: 1.0000 | ORAL_TABLET | ORAL | 0 refills | Status: DC | PRN

## 2016-08-23 MED ORDER — MIDAZOLAM HCL 5 MG/5ML IJ SOLN
INTRAMUSCULAR | Status: DC | PRN
  Administered 2016-08-23: 2 mg via INTRAVENOUS

## 2016-08-23 MED ORDER — BELLADONNA ALKALOIDS-OPIUM 16.2-60 MG RE SUPP
RECTAL | Status: AC
  Filled 2016-08-23: qty 1

## 2016-08-23 MED ORDER — LACTATED RINGERS IV SOLN
INTRAVENOUS | Status: DC
  Administered 2016-08-23 (×2): via INTRAVENOUS
  Filled 2016-08-23: qty 1000

## 2016-08-23 MED FILL — HYDROCODON-APAP 10-325: 10-325 | 3 days supply | Qty: 20 | Fill #0

## 2016-08-23 SURGICAL SUPPLY — 31 items
BAG DRAIN URO-CYSTO SKYTR STRL (DRAIN) ×2 IMPLANT
BAG DRN ANRFLXCHMBR STRAP LEK (BAG)
BAG DRN UROCATH (DRAIN) ×1
BAG URINE DRAINAGE (UROLOGICAL SUPPLIES) ×1 IMPLANT
BAG URINE LEG 19OZ MD ST LTX (BAG) IMPLANT
CATH FOLEY 3WAY 20FR (CATHETERS) IMPLANT
CATH FOLEY 3WAY 30CC 24FR (CATHETERS)
CATH HEMA 3WAY 30CC 24FR COUDE (CATHETERS) IMPLANT
CATH HEMA 3WAY 30CC 24FR RND (CATHETERS) ×1 IMPLANT
CATH URTH STD 24FR FL 3W 2 (CATHETERS) ×1 IMPLANT
CLOTH BEACON ORANGE TIMEOUT ST (SAFETY) ×2 IMPLANT
ELECT BUTTON BIOP 24F 90D PLAS (MISCELLANEOUS) IMPLANT
ELECT LOOP MED HF 24F 12D (CUTTING LOOP) IMPLANT
ELECT REM PT RETURN 9FT ADLT (ELECTROSURGICAL) ×2
ELECTRODE REM PT RTRN 9FT ADLT (ELECTROSURGICAL) ×1 IMPLANT
EVACUATOR MICROVAS BLADDER (UROLOGICAL SUPPLIES) ×1 IMPLANT
GLOVE BIO SURGEON STRL SZ8 (GLOVE) ×2 IMPLANT
GOWN STRL REUS W/ TWL LRG LVL3 (GOWN DISPOSABLE) ×1 IMPLANT
GOWN STRL REUS W/ TWL XL LVL3 (GOWN DISPOSABLE) ×1 IMPLANT
GOWN STRL REUS W/TWL LRG LVL3 (GOWN DISPOSABLE) ×2
GOWN STRL REUS W/TWL XL LVL3 (GOWN DISPOSABLE) ×2
HOLDER FOLEY CATH W/STRAP (MISCELLANEOUS) IMPLANT
IV NS IRRIG 3000ML ARTHROMATIC (IV SOLUTION) ×3 IMPLANT
KIT ROOM TURNOVER WOR (KITS) ×2 IMPLANT
MANIFOLD NEPTUNE II (INSTRUMENTS) ×1 IMPLANT
PACK CYSTO (CUSTOM PROCEDURE TRAY) ×2 IMPLANT
PLUG CATH AND CAP STER (CATHETERS) ×1 IMPLANT
SYR 30ML LL (SYRINGE) ×1 IMPLANT
SYRINGE IRR TOOMEY STRL 70CC (SYRINGE) IMPLANT
TUBE CONNECTING 12X1/4 (SUCTIONS) ×1 IMPLANT
WATER STERILE IRR 3000ML UROMA (IV SOLUTION) IMPLANT

## 2016-08-23 NOTE — Anesthesia Preprocedure Evaluation (Signed)
Anesthesia Evaluation  Patient identified by MRN, date of birth, ID band Patient awake    Reviewed: Allergy & Precautions, NPO status , Patient's Chart, lab work & pertinent test results  Airway Mallampati: II  TM Distance: >3 FB Neck ROM: Full    Dental no notable dental hx.    Pulmonary neg pulmonary ROS, former smoker,    Pulmonary exam normal breath sounds clear to auscultation       Cardiovascular negative cardio ROS Normal cardiovascular exam Rhythm:Regular Rate:Normal     Neuro/Psych negative neurological ROS  negative psych ROS   GI/Hepatic negative GI ROS, Neg liver ROS,   Endo/Other  negative endocrine ROS  Renal/GU negative Renal ROS  negative genitourinary   Musculoskeletal negative musculoskeletal ROS (+)   Abdominal   Peds negative pediatric ROS (+)  Hematology negative hematology ROS (+)   Anesthesia Other Findings   Reproductive/Obstetrics negative OB ROS                             Anesthesia Physical Anesthesia Plan  ASA: II  Anesthesia Plan: General   Post-op Pain Management:    Induction: Intravenous  Airway Management Planned: LMA  Additional Equipment:   Intra-op Plan:   Post-operative Plan: Extubation in OR  Informed Consent: I have reviewed the patients History and Physical, chart, labs and discussed the procedure including the risks, benefits and alternatives for the proposed anesthesia with the patient or authorized representative who has indicated his/her understanding and acceptance.   Dental advisory given  Plan Discussed with: CRNA and Surgeon  Anesthesia Plan Comments:         Anesthesia Quick Evaluation

## 2016-08-23 NOTE — Anesthesia Procedure Notes (Signed)
Procedure Name: LMA Insertion Date/Time: 08/23/2016 9:26 AM Performed by: Denna Haggard D Pre-anesthesia Checklist: Patient identified, Emergency Drugs available, Suction available and Patient being monitored Patient Re-evaluated:Patient Re-evaluated prior to inductionOxygen Delivery Method: Circle system utilized Preoxygenation: Pre-oxygenation with 100% oxygen Intubation Type: IV induction Ventilation: Mask ventilation without difficulty LMA: LMA inserted LMA Size: 4.0 Number of attempts: 1 Airway Equipment and Method: Bite block Placement Confirmation: positive ETCO2 Tube secured with: Tape Dental Injury: Teeth and Oropharynx as per pre-operative assessment

## 2016-08-23 NOTE — Discharge Instructions (Signed)
Post Anesthesia Home Care Instructions  Activity: Get plenty of rest for the remainder of the day. A responsible adult should stay with you for 24 hours following the procedure.  For the next 24 hours, DO NOT: -Drive a car -Paediatric nurse -Drink alcoholic beverages -Take any medication unless instructed by your physician -Make any legal decisions or sign important papers.  Meals: Start with liquid foods such as gelatin or soup. Progress to regular foods as tolerated. Avoid greasy, spicy, heavy foods. If nausea and/or vomiting occur, drink only clear liquids until the nausea and/or vomiting subsides. Call your physician if vomiting continues.  Special Instructions/Symptoms: Your throat may feel dry or sore from the anesthesia or the breathing tube placed in your throat during surgery. If this causes discomfort, gargle with warm salt water. The discomfort should disappear within 24 hours.  If you had a scopolamine patch placed behind your ear for the management of post- operative nausea and/or vomiting:  1. The medication in the patch is effective for 72 hours, after which it should be removed.  Wrap patch in a tissue and discard in the trash. Wash hands thoroughly with soap and water. 2. You may remove the patch earlier than 72 hours if you experience unpleasant side effects which may include dry mouth, dizziness or visual disturbances. 3. Avoid touching the patch. Wash your hands with soap and water after contact with the patch.   Post transurethral resection of the prostate (TURP) instructions  Your recent prostate surgery requires very special post hospital care. Despite the fact that no skin incisions were used the area around the prostate incision is quite raw and is covered with a scab to promote healing and prevent bleeding. Certain cautions are needed to assure that the scab is not disturbed of the next 2-3 weeks while the healing proceeds.  Because the raw surface in your  prostate and the irritating effects of urine you may expect frequency of urination and/or urgency (a stronger desire to urinate) and perhaps even getting up at night more often. This will usually resolve or improve slowly over the healing period. You may see some blood in your urine over the first 6 weeks. Do not be alarmed, even if the urine was clear for a while. Get off your feet and drink lots of fluids until clearing occurs. If you start to pass clots or don't improve call us.  Catheter: (If you are discharged with a catheter.) 1. Keep your catheter secured to your leg at all times with tape or the supplied strap. 2. You may experience leakage of urine around your catheter- as long as the  catheter continues to drain, this is normal.  If your catheter stops draining  go to the ER. 3. You may also have blood in your urine, even after it has been clear for  several days; you may even pass some small blood clots or other material.  This  is normal as well.  If this happens, sit down and drink plenty of water to help  make urine to flush out your bladder.  If the blood in your urine becomes worse  after doing this, contact our office or return to the ER. 4. You may use the leg bag (small bag) during the day, but use the large bag at  night.  Diet:  You may return to your normal diet immediately. Because of the raw surface of your bladder, alcohol, spicy foods, foods high in acid and drinks with caffeine may  cause irritation or frequency and should be used in moderation. To keep your urine flowing freely and avoid constipation, drink plenty of fluids during the day (8-10 glasses). Tip: Avoid cranberry juice because it is very acidic.  Activity:  Your physical activity doesn't need to be restricted. However, if you are very active, you may see some blood in the urine. We suggest that you reduce your activity under the circumstances until the bleeding has stopped.  Bowels:  It is important to  keep your bowels regular during the postoperative period. Straining with bowel movements can cause bleeding. A bowel movement every other day is reasonable. Use a mild laxative if needed, such as milk of magnesia 2-3 tablespoons, or 2 Dulcolax tablets. Call if you continue to have problems. If you had been taking narcotics for pain, before, during or after your surgery, you may be constipated. Take a laxative if necessary.  Medication:  You should resume your pre-surgery medications unless told not to. DO NOT RESUME YOUR ASPIRIN, WARFARIN, OR OTHER BLOOD THINNER FOR 1 WEEK. In addition you may be given an antibiotic to prevent or treat infection. Antibiotics are not always necessary. All medication should be taken as prescribed until the bottles are finished unless you are having an unusual reaction to one of the drugs.     Problems you should report to [place]:  a. Fever greater than 101F. b. Heavy bleeding, or clots (see notes above about blood in urine). c. Inability to urinate. d. Drug reactions (hives, rash, nausea, vomiting, diarrhea). e. Severe burning or pain with urination that is not improving.

## 2016-08-23 NOTE — H&P (Signed)
HPI: Wesley Wise is a 55 year-old male with BPH and lower urinary tract symptoms.  The patient complains of lower urinary tract symptom(s) that include weak stream. His urinary symptoms have been stable over the last 6 months. The patient states his most bothersome symptom(s) are the following: weak stream.   He denies any other associated symptoms. The patient states if he were to spend the rest of his life with his current urinary condition, he would be mixed.   01/11/15: He reported in 5/16 that over the course of about 12 months or more he had noted progressive urinary symptoms consisting of a slowing of his urinary stream with increased nocturia as well as intermittency, hesitancy and straining to urinate. He had been taking an over-the-counter allergy medicine but this did not resulted in any change in his voiding pattern.  PVR - 0  Treatment: Tamsulosin   Interval history 07/16/16: he reports to me that after trying to tamsulosin he really didn't notice any improvement in the force of his urinary stream. He notes his chief complaint is that of decreased pressure. He said he doesn't really have a lot of hesitancy. He denies any dysuria or hematuria. He also wanted to discuss difficulties with erectile dysfunction.   Interval history 08/09/16: Due to a complaint of continued decreased voiding pressure which did not initially respond to tamsulosin I had him try an alternative alpha blocker. He was given samples of Rapaflo with the understanding that if he continued to have voiding symptoms further evaluation for an anatomic obstruction with cystoscopy would be necessary. The Rapaflo also resulted in no improvement whatsoever in his urinary symptoms.     CC: I am having difficulty with my erections.  HPI: He does have problems with erectile dysfunction. He noticed his ED approximately 06/26/2014. His symptoms did begin gradually. He does not have difficulties achieving an erection. He does have  trouble maintaining his erection. His libido has decreased.   He has tried Viagra for his erectile dysfunction. His medication allows him to achieve satisfactory erections.   He said he has tried sildenafil in the past at 20 mg and 40 mg but said his erections are still not as firm as he would like.   Interval history 08/09/16: His last visit he was prescribed sildenafil and used 100 mg but still was unable to achieve a satisfactory erection.       ALLERGIES: No Allergies    MEDICATIONS: Aspirin 81 MG TABS Oral  Crestor 10 MG Oral Tablet Oral     GU PSH: None     PSH Notes: No Surgical Problems   NON-GU PSH: None   GU PMH: BPH w/LUTS (Stable), He continues to have decreased pressure and we discussed the possible etiologies. He did not respond to tamsulosin so I suggested a trial of Rapaflo. If he does not note improvement on Rapaflo I told him I would evaluate him further for an anatomic obstruction such as a stricture with cystoscopy when he returns. - 07/16/2016, BPH with obstruction/lower urinary tract symptoms, - 2016 ED, arterial insufficiency, He has a history of erectile dysfunction. Sildenafil has been partially effective for him at a dosage up to 40 mg what we discussed the fact that he could take up to 100 mg. - 07/16/2016, Erectile dysfunction due to arterial insufficiency, - 2016 Encounter for Prostate Cancer screening, Prostate cancer screening - 2016 Urinary Frequency, Urinary frequency - 2016      PMH Notes: Erectile dysfunction: This has been managed  with Viagra. He uses of 100 mg and cuts it into quarters.     NON-GU PMH: Encounter for general adult medical examination without abnormal findings, Encounter for preventive health examination - 2016    FAMILY HISTORY: No pertinent family history - Runs In Family   SOCIAL HISTORY: Marital Status: Married Current Smoking Status: Patient does not smoke anymore. Has not smoked since 06/26/2010.  Does drink.  Drinks 2  caffeinated drinks per day.     Notes: Alcohol use, Former smoker, Married, Caffeine use   REVIEW OF SYSTEMS:    GU Review Male:   Patient reports trouble starting your stream, have to strain to urinate , and erection problems. Patient denies frequent urination, hard to postpone urination, burning/ pain with urination, get up at night to urinate, leakage of urine, stream starts and stops, and penile pain.  Gastrointestinal (Upper):   Patient denies nausea, vomiting, and indigestion/ heartburn.  Gastrointestinal (Lower):   Patient denies diarrhea and constipation.  Constitutional:   Patient denies fever, night sweats, fatigue, and weight loss.  Skin:   Patient denies skin rash/ lesion and itching.  Eyes:   Patient denies blurred vision and double vision.  Ears/ Nose/ Throat:   Patient denies sore throat and sinus problems.  Hematologic/Lymphatic:   Patient denies swollen glands and easy bruising.  Cardiovascular:   Patient denies leg swelling and chest pains.  Respiratory:   Patient denies cough and shortness of breath.  Endocrine:   Patient denies excessive thirst.  Musculoskeletal:   Patient denies back pain and joint pain.  Neurological:   Patient denies headaches and dizziness.  Psychologic:   Patient denies depression and anxiety.   Notes: Reviewed previous review of systems 07/16/2016. No changes.    VITAL SIGNS:     Weight 250 lb / 113.4 kg  Height 72 in / 182.88 cm  BP 117/68 mmHg  Pulse 61 /min  BMI 33.9 kg/m   Physical Exam  Constitutional: Well nourished and well developed . No acute distress.   ENT:. The ears and nose are normal in appearance.   Neck: The appearance of the neck is normal and no neck mass is present.   Pulmonary: No respiratory distress and normal respiratory rhythm and effort.   Cardiovascular: Heart rate and rhythm are normal . No peripheral edema.   Abdomen: The abdomen is soft and nontender. No masses are palpated. No CVA tenderness. No  hernias are palpable. No hepatosplenomegaly noted.   Rectal: Rectal exam demonstrates normal sphincter tone, no tenderness and no masses. Estimated prostate size is 1+. The prostate has no nodularity and is not tender. The left seminal vesicle is nonpalpable. The right seminal vesicle is nonpalpable. The perineum is normal on inspection.   Genitourinary: Examination of the penis demonstrates no discharge, no masses, no lesions and a normal meatus. The scrotum is without lesions. The right epididymis is palpably normal and non-tender. The left epididymis is palpably normal and non-tender. The right testis is non-tender and without masses. The left testis is non-tender and without masses.   Lymphatics: The femoral and inguinal nodes are not enlarged or tender.   Skin: Normal skin turgor, no visible rash and no visible skin lesions.   Neuro/Psych:. Mood and affect are appropriate.    PAST DATA REVIEWED:  Source Of History:  Patient  Records Review:   Previous Patient Records, POC Tool   07/16/16 01/12/15  PSA  Total PSA 0.33 ng/dl 0.49   Free PSA  0.29   %  Free PSA  59     PROCEDURES:         Flexible Cystoscopy - Done on 08/09/16 Risks, benefits, and some of the potential complications of the procedure were discussed at length with the patient including infection, bleeding, voiding discomfort, urinary retention, fever, chills, sepsis, and others. All questions were answered. Informed consent was obtained. Sterile technique and 2% Lidocaine intraurethral analgesia were used.  Meatus:  Normal size. Normal location. Normal condition.  Urethra:  No strictures.  External Sphincter:  Normal.  Verumontanum:  Normal.  Prostate:  Borderline obstructing. Enlarged median lobe. Mild hyperplasia.  Bladder Neck:  Non-obstructing.  Ureteral Orifices:  Normal location. Normal size. Normal shape. Effluxed clear urine.  Bladder:  No trabeculation. No tumors. Normal mucosa. No stones.      The lower  urinary tract was carefully examined. The procedure was well-tolerated and without complications. Instructions were given to call the office immediately for bloody urine, difficulty urinating, urinary retention, painful or frequent urination, fever or other illness. The patient stated that he understood these instructions and would comply with them.         Urinalysis Dipstick Dipstick Cont'd  Color: Amber Bilirubin: Neg  Appearance: Clear Ketones: Neg  Specific Gravity: 1.025 Blood: Neg  pH: 5.5 Protein: Neg  Glucose: Neg Urobilinogen: 1.0    Nitrites: Neg    Leukocyte Esterase: Neg    ASSESSMENT:      ICD-10 Details  1 GU:   BPH w/LUTS - N40.1 Stable - He is going to be scheduled for transurethral resection of his median lobe. He already experiences some retrograde ejaculation and understands that this is a possibility with this procedure.  2   ED, arterial insufficiency - N52.01 Stable - We discussed trying 120 mg of the sildenafil and making sure it is taken on an empty stomach. He will let me know how this works for him.              Notes:   The reason he has not responded to pharmacologic therapy for his voiding symptoms is the fact that he has a large, ball-valving median lobe component of his prostate. He had some mild lateral lobe hypertrophy but I did think that that is the primary cause of his voiding symptoms and we therefore discussed the fact that surgical management would be the only way to remedy this situation. I therefore discussed transurethral resection of his median lobe with him in detail and drew a picture. We discussed the procedure, its risks and complications, the probability of success and the potential outpatient nature of the procedure with the possibility of the need for an overnight stay.    PLAN: TURP

## 2016-08-23 NOTE — Anesthesia Postprocedure Evaluation (Signed)
Anesthesia Post Note  Patient: Wesley Wise  Procedure(s) Performed: Procedure(s) (LRB): TRANSURETHRAL RESECTION OF THE PROSTATE (TURP) (N/A)  Patient location during evaluation: PACU Anesthesia Type: General Level of consciousness: awake and alert Pain management: pain level controlled Vital Signs Assessment: post-procedure vital signs reviewed and stable Respiratory status: spontaneous breathing, nonlabored ventilation, respiratory function stable and patient connected to nasal cannula oxygen Cardiovascular status: blood pressure returned to baseline and stable Postop Assessment: no signs of nausea or vomiting Anesthetic complications: no       Last Vitals:  Vitals:   08/23/16 1015 08/23/16 1030  BP: 122/80 122/86  Pulse: 71 66  Resp: 16 (!) 21  Temp:      Last Pain:  Vitals:   08/23/16 1013  TempSrc:   PainSc: 0-No pain                 Parris Signer S

## 2016-08-23 NOTE — Transfer of Care (Signed)
Immediate Anesthesia Transfer of Care Note  Patient: Wesley Wise  Procedure(s) Performed: Procedure(s) (LRB): TRANSURETHRAL RESECTION OF THE PROSTATE (TURP) (N/A)  Patient Location: PACU  Anesthesia Type: General  Level of Consciousness: awake, oriented, sedated and patient cooperative  Airway & Oxygen Therapy: Patient Spontanous Breathing and Patient connected to face mask oxygen  Post-op Assessment: Report given to PACU RN and Post -op Vital signs reviewed and stable  Post vital signs: Reviewed and stable  Complications: No apparent anesthesia complications Last Vitals:  Vitals:   08/23/16 0716 08/23/16 1013  BP: 133/82 (!) (P) 119/32  Pulse: 69 (P) 71  Resp: 16 (P) 20  Temp: 36.7 C (P) 36.4 C

## 2016-08-23 NOTE — Op Note (Signed)
PATIENT:  Wesley Wise  PRE-OPERATIVE DIAGNOSIS: BPH with outlet obstruction  POST-OPERATIVE DIAGNOSIS: Same  PROCEDURE: TURP  SURGEON:  Claybon Jabs  INDICATION: [name] is a 55 year old male with significant obstructive voiding symptoms. Conservative management with alpha blockade therapy was unsuccessful. Cystoscopic evaluation preoperatively revealed no evidence of stricture and minimal lateral lobe hypertrophy but a ball valving median lobe. He was also noted to have bladder trabeculation consistent with long-standing outlet obstruction. We've discussed the treatment options and he has elected to proceed with surgical management.  ANESTHESIA:  General  EBL:  Minimal  DRAINS: None  SPECIMEN:  Prostate chips to pathology  After informed consent the patient was brought to the major OR and placed on the table. He was administered general anesthesia and then moved to the dorsal lithotomy position. His genitalia was sterilely prepped and draped and an official timeout was then performed.  Initially the 28 French resectoscope sheath with the visual obturator was passed into the bladder and the obturator removed. I then inserted the resectoscope element with 30 lens and  performed a systematic inspection of the bladder. I noted the bladder had 3+ trabeculation but was free of any tumor stones or inflammatory lesions. The ureteral orifices were noted to be of normal configuration and position. Withdrawing the scope into the prostatic urethra I noted obstructing median lobe component that was serving as a ball-valve. His prostatic urethra was not particularly elongated and he did not have much in the way of lateral lobe hypertrophy. This was photographed.  I then inserted the resectoscope element with 30 lens and  I then began resecting the median lobe down to the level of the bladder neck. I then performed a resection of the floor of the prostate from the level of the bladder neck  back to the level of the veru. I resected little of the lateral lobes as they were nonobstructing and this was photographed. This was performed with care evening taken to maintain the resection proximal to the Veru at all times. The prostatic chips were then flushed into the bladder and the Microvasive evacuator was then used to evacuate all chips from the bladder. Reinspection of the bladder revealed the mucosa to be intact, the ureteral orifices intact as well and well away from the bladder neck and area of resection. There were no prostatic chips remaining within the bladder. The prostatic capsule was intact throughout with no perforation and there was no active bleeding noted at the end of the procedure.  The resectoscope was therefore removed and lidocaine jelly was placed in the urethra and a penile clamp was applied. The patient was awakened and taken to recovery room in stable and satisfactory condition. He tolerated the procedure well and there were no intraoperative complications.

## 2016-08-27 ENCOUNTER — Encounter (HOSPITAL_BASED_OUTPATIENT_CLINIC_OR_DEPARTMENT_OTHER): Payer: Self-pay | Admitting: Urology

## 2017-04-13 ENCOUNTER — Other Ambulatory Visit: Payer: Self-pay | Admitting: Cardiology

## 2017-05-16 ENCOUNTER — Other Ambulatory Visit: Payer: Self-pay | Admitting: Cardiology

## 2017-06-07 ENCOUNTER — Other Ambulatory Visit: Payer: Self-pay | Admitting: Cardiology

## 2017-06-15 ENCOUNTER — Other Ambulatory Visit: Payer: Self-pay | Admitting: Cardiology

## 2017-06-24 ENCOUNTER — Encounter: Payer: Self-pay | Admitting: Cardiology

## 2017-06-24 ENCOUNTER — Encounter (INDEPENDENT_AMBULATORY_CARE_PROVIDER_SITE_OTHER): Payer: Self-pay

## 2017-06-24 ENCOUNTER — Ambulatory Visit (INDEPENDENT_AMBULATORY_CARE_PROVIDER_SITE_OTHER): Payer: BLUE CROSS/BLUE SHIELD | Admitting: Cardiology

## 2017-06-24 VITALS — BP 136/62 | HR 65 | Ht 72.0 in | Wt 255.0 lb

## 2017-06-24 DIAGNOSIS — Z8249 Family history of ischemic heart disease and other diseases of the circulatory system: Secondary | ICD-10-CM | POA: Diagnosis not present

## 2017-06-24 DIAGNOSIS — E785 Hyperlipidemia, unspecified: Secondary | ICD-10-CM

## 2017-06-24 NOTE — Progress Notes (Signed)
Patient ID: Wesley Wise, male   DOB: May 12, 1961, 56 y.o.   MRN: [id number]    Patient Name: Wesley Wise Date of Encounter: 06/24/2017  Primary Care Provider:  Aletha Halim., PA-C Primary Cardiologist:  Ena Dawley  Problem List   Past Medical History:  Diagnosis Date  . BPH (benign prostatic hyperplasia)   . HOH (hard of hearing)    hearing aid to left ear  . Pure hypercholesterolemia    Past Surgical History:  Procedure Laterality Date  . TRANSURETHRAL RESECTION OF PROSTATE N/A 08/23/2016   Procedure: TRANSURETHRAL RESECTION OF THE PROSTATE (TURP);  Surgeon: Kathie Rhodes, MD;  Location: [facility];  Service: Urology;  Laterality: N/A;    Allergies  No Known Allergies  HPI  Wesley Wise is a pleasant 56 year old gentleman who was previously followed by Dr. Verl Wise for hyperlipidemia and family history of premature coronary artery disease. Patient was previously diagnosed with high cholesterol and started on Crestor 10 mg daily that he was taking for a few years, however ran out and didn't have any refills, stopped in March 2015. Last cholesterol check we have in our system showed HDL 31, LDL 68 and triglycerides 44. This was while he wasl still taking his Crestor. Patient states that he is not exceptionally active but can certainly walk a few miles without any symptoms of chest pain, shortness of breath, palpitations or syncope. He also denies orthopnea, paroxysmal nocturnal dyspnea, claudication. He states that 3 of his brothers had myocardial infarction in their 35s and 44s as well as one of his sisters.   10/31/2015 - 56 year follow-up, patient is feeling great, last year he underwent calcium scoring it was 171, an exercise nuclear stress test was ordered and it was negative for prior infarct or ischemia. His lipid profile showed elevated triglycerides and he was started on rosuvastatin 10 mg daily that he takes with no side effects. He denies any chest  pain or shortness of breath however in the last year his job has changed is now more sedentary.  06/24/17 -56 year follow-up, the patient has been doing great, the patient has been active and denies any chest pain or shortness of breath.  He has been compliant with Crestor and has no side effects.  He denies any palpitations no syncope.  Home Medications  Prior to Admission medications   Medication Sig Start Date End Date Taking? Authorizing Provider  aspirin 81 MG tablet Take 81 mg by mouth daily.   Yes Historical Provider, MD  Multiple Vitamin (MULTIVITAMIN) tablet Take 1 tablet by mouth daily.   Yes Historical Provider, MD    Family History  Family History  Problem Relation Age of Onset  . Heart attack Father 65  . CAD Brother   . Heart Problems Brother 30       CABG    Social History  Social History   Social History  . Marital status: Married    Spouse name: N/A  . Number of children: N/A  . Years of education: N/A   Occupational History  . Not on file.   Social History Main Topics  . Smoking status: Former Smoker    Types: Cigarettes    Quit date: 08/20/2009  . Smokeless tobacco: Never Used  . Alcohol use 6.0 - 12.0 oz/week    10 - 20 Cans of beer per week  . Drug use: No  . Sexual activity: Not on file   Other Topics Concern  .  Not on file   Social History Narrative  . No narrative on file     Review of Systems, as per HPI, otherwise negative General:  No chills, fever, night sweats or weight changes.  Cardiovascular:  No chest pain, dyspnea on exertion, edema, orthopnea, palpitations, paroxysmal nocturnal dyspnea. Dermatological: No rash, lesions/masses Respiratory: No cough, dyspnea Urologic: No hematuria, dysuria Abdominal:   No nausea, vomiting, diarrhea, bright red blood per rectum, melena, or hematemesis Neurologic:  No visual changes, wkns, changes in mental status. All other systems reviewed and are otherwise negative except as noted  above.  Physical Exam  Blood pressure 136/62, pulse 65, height 6' (1.829 m), weight 255 lb (115.7 kg), SpO2 96 %.  General: Pleasant, NAD Psych: Normal affect. Neuro: Alert and oriented X 3. Moves all extremities spontaneously. HEENT: Normal  Neck: Supple without bruits or JVD. Lungs:  Resp regular and unlabored, CTA. Heart: RRR no s3, s4, or murmurs. Abdomen: Soft, non-tender, non-distended, BS + x 4.  Extremities: No clubbing, cyanosis or edema. DP/PT/Radials 2+ and equal bilaterally.  Labs:  No results for input(s): CKTOTAL, CKMB, TROPONINI in the last 72 hours. Lab Results  Component Value Date   HGB 16.2 08/23/2016    No results found for: DDIMER Invalid input(s): POCBNP    Component Value Date/Time   NA 139 10/30/2015 0904   K 4.5 10/30/2015 0904   CL 106 10/30/2015 0904   CO2 23 10/30/2015 0904   GLUCOSE 103 (H) 10/30/2015 0904   BUN 16 10/30/2015 0904   CREATININE 1.02 10/30/2015 0904   CALCIUM 9.2 10/30/2015 0904   PROT 6.5 10/30/2015 0904   ALBUMIN 4.3 10/30/2015 0904   AST 22 10/30/2015 0904   ALT 23 10/30/2015 0904   ALKPHOS 50 10/30/2015 0904   BILITOT 0.8 10/30/2015 0904   GFRNONAA 76.03 11/18/2008 1157   Lab Results  Component Value Date   CHOL 127 10/30/2015   HDL 34 (L) 10/30/2015   LDLCALC 80 10/30/2015   TRIG 65 10/30/2015   Accessory Clinical Findings  Echocardiogram - none  ECG - SR, LAD, LAFB, otherwise normal, unchanged from 2013.  Calcium score: 09/2014 IMPRESSION: Coronary calcium score of 171. This was 43 percentile for age and sex matched control.  [place]  1. Hyperlipidemia and significant family history of premature coronary artery disease with some of his siblings having heart attack without any warning.  Continue rosuvastatin 10 mg daily as a primary prevention with his family history and elevated calcium score. No ischemic workup needed at this time, he is advised to increase his level  of exercise. Recheck CMP, TSH, NMR lipids today.  2. Erectile dysfunction - we prescribed Viagra 50 mg with no improvement.   Follow up in 1 year.  Ena Dawley, MD, [facility] 06/24/2017, 2:18 PM

## 2017-06-24 NOTE — Patient Instructions (Addendum)
  Medication Instructions:  Your physician recommends that you continue on your current medications as directed. Please refer to the Current Medication list given to you today.   Labwork: Your physician recommends that you return for lab work in: TODAY (NMR lipids, BMET, CBC, TSH, LFT)   Testing/Procedures: none  Follow-Up: Your physician wants you to follow-up in: 12 months with Dr. Meda Coffee. You will receive a reminder letter in the mail two months in advance. If you don't receive a letter, please call our office to schedule the follow-up appointment.   Any Other Special Instructions Will Be Listed Below (If Applicable).     If you need a refill on your cardiac medications before your next appointment, please call your pharmacy.

## 2017-06-25 ENCOUNTER — Telehealth: Payer: Self-pay

## 2017-06-25 DIAGNOSIS — E7800 Pure hypercholesterolemia, unspecified: Secondary | ICD-10-CM

## 2017-06-25 LAB — CBC WITH DIFFERENTIAL/PLATELET
Basophils Absolute: 0 10*3/uL (ref 0.0–0.2)
Basos: 1 %
EOS (ABSOLUTE): 0.1 10*3/uL (ref 0.0–0.4)
Eos: 2 %
Hematocrit: 46.1 % (ref 37.5–51.0)
Hemoglobin: 15.6 g/dL (ref 13.0–17.7)
Immature Grans (Abs): 0 10*3/uL (ref 0.0–0.1)
Immature Granulocytes: 0 %
Lymphocytes Absolute: 2.3 10*3/uL (ref 0.7–3.1)
Lymphs: 39 %
MCH: 28.7 pg (ref 26.6–33.0)
MCHC: 33.8 g/dL (ref 31.5–35.7)
MCV: 85 fL (ref 79–97)
Monocytes Absolute: 0.6 10*3/uL (ref 0.1–0.9)
Monocytes: 10 %
Neutrophils Absolute: 2.9 10*3/uL (ref 1.4–7.0)
Neutrophils: 48 %
Platelets: 196 10*3/uL (ref 150–379)
RBC: 5.43 x10E6/uL (ref 4.14–5.80)
RDW: 13.6 % (ref 12.3–15.4)
WBC: 6.1 10*3/uL (ref 3.4–10.8)

## 2017-06-25 LAB — NMR, LIPOPROFILE
Cholesterol: 127 mg/dL (ref 100–199)
HDL Cholesterol by NMR: 33 mg/dL — ABNORMAL LOW (ref >39)
HDL Particle Number: 28.3 umol/L — ABNORMAL LOW (ref >=30.5)
LDL Particle Number: 1187 nmol/L — ABNORMAL HIGH (ref <1000)
LDL Size: 20.4 nm (ref >20.5)
LDL-C: 79 mg/dL (ref 0–99)
LP-IR Score: 56 — ABNORMAL HIGH (ref <=45)
Small LDL Particle Number: 718 nmol/L — ABNORMAL HIGH (ref <=527)
Triglycerides by NMR: 76 mg/dL (ref 0–149)

## 2017-06-25 LAB — BASIC METABOLIC PANEL
BUN/Creatinine Ratio: 12 (ref 9–20)
BUN: 12 mg/dL (ref 6–24)
CO2: 25 mmol/L (ref 20–29)
Calcium: 9.2 mg/dL (ref 8.7–10.2)
Chloride: 105 mmol/L (ref 96–106)
Creatinine, Ser: 1.03 mg/dL (ref 0.76–1.27)
GFR calc Af Amer: 93 mL/min/{1.73_m2} (ref >59)
GFR calc non Af Amer: 81 mL/min/{1.73_m2} (ref >59)
Glucose: 94 mg/dL (ref 65–99)
Potassium: 4.3 mmol/L (ref 3.5–5.2)
Sodium: 143 mmol/L (ref 134–144)

## 2017-06-25 LAB — TSH: TSH: 1.51 u[IU]/mL (ref 0.450–4.500)

## 2017-06-25 LAB — HEPATIC FUNCTION PANEL
ALT: 30 IU/L (ref 0–44)
AST: 21 IU/L (ref 0–40)
Albumin: 4.3 g/dL (ref 3.5–5.5)
Alkaline Phosphatase: 55 IU/L (ref 39–117)
Bilirubin Total: 0.5 mg/dL (ref 0.0–1.2)
Bilirubin, Direct: 0.14 mg/dL (ref 0.00–0.40)
Total Protein: 6.3 g/dL (ref 6.0–8.5)

## 2017-06-25 MED ORDER — ROSUVASTATIN CALCIUM 20 MG PO TABS: 20.0000 mg | ORAL_TABLET | Freq: Every day | ORAL | 3 refills | Status: DC

## 2017-06-25 NOTE — Telephone Encounter (Signed)
-----   Message from Dorothy Spark, MD sent at 06/25/2017  2:17 PM EDT ----- Normal electrolytes, kidney and liver function, CBC and thyroid function.  His LDL remains elevated, I would increase Crestor to 20 mg daily.

## 2017-06-25 NOTE — Telephone Encounter (Signed)
Called and made patient aware of lab results and recommendations to increase crestor to 20 mg QD. Rx sent to patient's preferred pharmacy.  Recheck for labs ordered for 3 months on 09/26/17.

## 2017-06-26 ENCOUNTER — Other Ambulatory Visit: Payer: Self-pay | Admitting: *Deleted

## 2017-06-26 MED ORDER — ROSUVASTATIN CALCIUM 10 MG PO TABS: 20.0000 mg | ORAL_TABLET | Freq: Every day | ORAL | 3 refills | Status: DC

## 2017-06-26 NOTE — Telephone Encounter (Signed)
Patient called and stated that per [facility] his insurance will not cover rosuvastatin 20 mg tabs but they will cover the 10 mg tab with the patient taking two tabs qd. He is aware that I will update the rx for the 10 mg tab and have him take two tabs at once daily to equal the prescribed dose of 20 mg. Patient verbalized his understanding and will call the office back if he has any further questions or concerns.

## 2017-09-26 ENCOUNTER — Other Ambulatory Visit: Payer: BLUE CROSS/BLUE SHIELD

## 2017-10-23 ENCOUNTER — Other Ambulatory Visit: Payer: BLUE CROSS/BLUE SHIELD | Admitting: *Deleted

## 2017-10-23 DIAGNOSIS — E7800 Pure hypercholesterolemia, unspecified: Secondary | ICD-10-CM

## 2017-10-24 LAB — HEPATIC FUNCTION PANEL
ALT: 29 IU/L (ref 0–44)
AST: 18 IU/L (ref 0–40)
Albumin: 4.3 g/dL (ref 3.5–5.5)
Alkaline Phosphatase: 55 IU/L (ref 39–117)
Bilirubin Total: 0.8 mg/dL (ref 0.0–1.2)
Bilirubin, Direct: 0.18 mg/dL (ref 0.00–0.40)
Total Protein: 6.5 g/dL (ref 6.0–8.5)

## 2017-10-24 LAB — NMR, LIPOPROFILE
Cholesterol, Total: 130 mg/dL (ref 100–199)
HDL Particle Number: 26.9 umol/L — ABNORMAL LOW (ref >=30.5)
HDL-C: 32 mg/dL — ABNORMAL LOW (ref >39)
LDL Particle Number: 1142 nmol/L — ABNORMAL HIGH (ref <1000)
LDL Size: 20.1 nm — ABNORMAL LOW (ref >20.5)
LDL-C: 79 mg/dL (ref 0–99)
LP-IR Score: 71 — ABNORMAL HIGH (ref <=45)
Small LDL Particle Number: 780 nmol/L — ABNORMAL HIGH (ref <=527)
Triglycerides: 93 mg/dL (ref 0–149)

## 2017-10-24 LAB — APOLIPOPROTEIN B: Apolipoprotein B: 74 mg/dL (ref <90)

## 2017-10-24 LAB — LIPOPROTEIN A (LPA): Lipoprotein (a): 26 nmol/L (ref <75)

## 2017-10-27 ENCOUNTER — Other Ambulatory Visit: Payer: BLUE CROSS/BLUE SHIELD

## 2018-06-19 ENCOUNTER — Other Ambulatory Visit: Payer: Self-pay | Admitting: Cardiology

## 2018-07-18 ENCOUNTER — Other Ambulatory Visit: Payer: Self-pay | Admitting: Cardiology

## 2018-07-21 ENCOUNTER — Other Ambulatory Visit: Payer: Self-pay | Admitting: Cardiology

## 2018-08-19 ENCOUNTER — Other Ambulatory Visit: Payer: Self-pay | Admitting: Cardiology

## 2019-02-23 ENCOUNTER — Telehealth: Payer: Self-pay

## 2019-02-23 DIAGNOSIS — Z8249 Family history of ischemic heart disease and other diseases of the circulatory system: Secondary | ICD-10-CM | POA: Insufficient documentation

## 2019-02-23 NOTE — Telephone Encounter (Signed)
Patient wishes to remain virtual for his appointment tomorrow.     Virtual Visit Pre-Appointment Phone Call  TELEPHONE CALL NOTE  Wesley Wise has been deemed a candidate for a follow-up tele-health visit to limit community exposure during the Covid-19 pandemic. I spoke with the patient via phone to ensure availability of phone/video source, confirm preferred email & phone number, and discuss instructions and expectations.  I reminded Wesley Wise to be prepared with any vital sign and/or heart rhythm information that could potentially be obtained via home monitoring, at the time of his visit. I reminded Wesley Wise to expect a phone call prior to his visit.  Patient agrees to consent below.  Cleon Gustin, RN 02/23/2019 9:10 AM   FULL LENGTH CONSENT FOR TELE-HEALTH VISIT   I hereby voluntarily request, consent and authorize [facility] and its employed or contracted physicians, physician assistants, nurse practitioners or other licensed health care professionals (the Practitioner), to provide me with telemedicine health care services (the "Services") as deemed necessary by the treating Practitioner. I acknowledge and consent to receive the Services by the Practitioner via telemedicine. I understand that the telemedicine visit will involve communicating with the Practitioner through live audiovisual communication technology and the disclosure of certain medical information by electronic transmission. I acknowledge that I have been given the opportunity to request an in-person assessment or other available alternative prior to the telemedicine visit and am voluntarily participating in the telemedicine visit.  I understand that I have the right to withhold or withdraw my consent to the use of telemedicine in the course of my care at any time, without affecting my right to future care or treatment, and that the Practitioner or I may terminate the telemedicine visit at any time. I  understand that I have the right to inspect all information obtained and/or recorded in the course of the telemedicine visit and may receive copies of available information for a reasonable fee.  I understand that some of the potential risks of receiving the Services via telemedicine include:  Marland Kitchen Delay or interruption in medical evaluation due to technological equipment failure or disruption; . Information transmitted may not be sufficient (e.g. poor resolution of images) to allow for appropriate medical decision making by the Practitioner; and/or  . In rare instances, security protocols could fail, causing a breach of personal health information.  Furthermore, I acknowledge that it is my responsibility to provide information about my medical history, conditions and care that is complete and accurate to the best of my ability. I acknowledge that Practitioner's advice, recommendations, and/or decision may be based on factors not within their control, such as incomplete or inaccurate data provided by me or distortions of diagnostic images or specimens that may result from electronic transmissions. I understand that the practice of medicine is not an exact science and that Practitioner makes no warranties or guarantees regarding treatment outcomes. I acknowledge that I will receive a copy of this consent concurrently upon execution via email to the email address I last provided but may also request a printed copy by calling the office of [place].    I understand that my insurance will be billed for this visit.   I have read or had this consent read to me. . I understand the contents of this consent, which adequately explains the benefits and risks of the Services being provided via telemedicine.  . I have been provided ample opportunity to ask questions regarding this consent and the Services  and have had my questions answered to my satisfaction. . I give my informed consent for the services to be  provided through the use of telemedicine in my medical care  By participating in this telemedicine visit I agree to the above.

## 2019-02-23 NOTE — Progress Notes (Signed)
Virtual Visit via Video Note   This visit type was conducted due to national recommendations for restrictions regarding the COVID-19 Pandemic (e.g. social distancing) in an effort to limit this patient's exposure and mitigate transmission in our community.  Due to his co-morbid illnesses, this patient is at least at moderate risk for complications without adequate follow up.  This format is felt to be most appropriate for this patient at this time.  All issues noted in this document were discussed and addressed.  A limited physical exam was performed with this format.  Please refer to the patient's chart for his consent to telehealth for [facility].   Date:  02/24/2019   ID:  Wesley Wise, DOB Nov 15, 1960, MRN [id number]  Patient Location: Home Provider Location: Home  PCP:  Aletha Halim., PA-C  Cardiologist:  Ena Dawley, MD   Electrophysiologist:  None   Evaluation Performed:  Follow-Up Visit  Chief Complaint:  Yearly f/u  History of Present Illness:    Wesley Wise is a 58 y.o. male with history of  HLD, family history of early CAD-siblings with MI's without warning. (father and 2 brothers with CABG sister with stents, 3 older brothers without heart problems).Calcium score 2017 171 and NST negative for ischemia.  Last saw Dr. Meda Coffee 05/2017 and doing well.   He works as a Administrator carrying cars around the country. Not much work now. He swims in his pool, does a lot of work around [employer], garden. No regular exercise outside of that.Hasn't had any blood work since he was last here. Denies chest pain, palpitations, dyspnea, dyspnea on exertion, dizziness or presyncope.      The patient does not have symptoms concerning for COVID-19 infection (fever, chills, cough, or new shortness of breath).    Past Medical History:  Diagnosis Date  . BPH (benign prostatic hyperplasia)   . HOH (hard of hearing)    hearing aid to left ear  . Pure hypercholesterolemia     Past Surgical History:  Procedure Laterality Date  . TRANSURETHRAL RESECTION OF PROSTATE N/A 08/23/2016   Procedure: TRANSURETHRAL RESECTION OF THE PROSTATE (TURP);  Surgeon: Kathie Rhodes, MD;  Location: [facility];  Service: Urology;  Laterality: N/A;     Current Meds  Medication Sig  . aspirin 81 MG tablet Take 81 mg by mouth daily.  . rosuvastatin (CRESTOR) 10 MG tablet Take 2 tablets (20 mg total) by mouth daily.  . [DISCONTINUED] rosuvastatin (CRESTOR) 10 MG tablet Take 2 tablets (20 mg total) by mouth daily. TAKE 2 TABLETS BY MOUTH DAILY. Please schedule appt for future refills. [phone]. 3rd & final attempt.     Allergies:   Patient has no known allergies.   Social History   Tobacco Use  . Smoking status: Former Smoker    Types: Cigarettes    Quit date: 08/20/2009    Years since quitting: 9.5  . Smokeless tobacco: Never Used  Substance Use Topics  . Alcohol use: Yes    Alcohol/week: 10.0 - 20.0 standard drinks    Types: 10 - 20 Cans of beer per week  . Drug use: No     Family Hx: The patient's family history includes CAD in his brother; Heart Problems (age of onset: 71) in his brother; Heart attack (age of onset: 17) in his father.  ROS:   Please see the history of present illness.    Review of Systems  Constitution: Negative.  HENT: Negative.  Cardiovascular: Negative.   Respiratory: Negative.   Endocrine: Negative.   Hematologic/Lymphatic: Negative.   Musculoskeletal: Negative.   Gastrointestinal: Negative.   Genitourinary: Negative.   Neurological: Negative.     All other systems reviewed and are negative.   Prior CV studies:   The following studies were reviewed today:   Calcium score: 09/2014 IMPRESSION: Coronary calcium score of 171. This was 35 percentile for age and sex matched control.   Ena Dawley   Labs/Other Tests and Data Reviewed:    EKG:  An ECG dated 10/30/15 was personally reviewed today and  demonstrated:  NSR   Recent Labs: No results found for requested labs within last 8760 hours.   Recent Lipid Panel Lab Results  Component Value Date/Time   CHOL 127 06/24/2017 02:40 PM   CHOL 161 09/02/2014 03:41 PM   TRIG 76 06/24/2017 02:40 PM   TRIG 182 (H) 09/02/2014 03:41 PM   HDL 33 (L) 06/24/2017 02:40 PM   HDL 28 (L) 09/02/2014 03:41 PM   CHOLHDL 3.7 10/30/2015 09:04 AM   LDLCALC 80 10/30/2015 09:04 AM   LDLCALC 97 09/02/2014 03:41 PM    Wt Readings from Last 3 Encounters:  02/22/19 250 lb (113.4 kg)  06/24/17 255 lb (115.7 kg)  08/23/16 252 lb (114.3 kg)     Objective:    Vital Signs:  BP 136/88   Pulse 78   Ht 6' (1.829 m)   Wt 250 lb (113.4 kg)   BMI 33.91 kg/m    VITAL SIGNS:  reviewed GEN:  no acute distress RESPIRATORY:  normal respiratory effort, symmetric expansion CARDIOVASCULAR:  no peripheral edema  ASSESSMENT & PLAN:    1. Family history of early CAD-no symptoms, recommend 150 min exercise weekly.  2. Hyperlipidemia-check surveillance labs  COVID-19 Education: The signs and symptoms of COVID-19 were discussed with the patient and how to seek care for testing (follow up with PCP or arrange E-visit).   The importance of social distancing was discussed today.  Time:   I have spent 25 minutes with the patient with telehealth-via video and phone technology discussing the above problems.     Medication Adjustments/Labs and Tests Ordered: Current medicines are reviewed at length with the patient today.  Concerns regarding medicines are outlined above.   Tests Ordered: Orders Placed This Encounter  Procedures  . Comprehensive metabolic panel  . Lipid panel  . TSH  . CBC    Medication Changes: Meds ordered this encounter  Medications  . rosuvastatin (CRESTOR) 10 MG tablet    Sig: Take 2 tablets (20 mg total) by mouth daily.    Dispense:  180 tablet    Refill:  3    Follow Up:  In Person in 1 year(s) Dr. Meda Coffee  Signed, Ermalinda Barrios, PA-C  02/24/2019 9:29 AM    Benjamin Perez

## 2019-02-24 ENCOUNTER — Encounter: Payer: Self-pay | Admitting: Physician Assistant

## 2019-02-24 ENCOUNTER — Telehealth (INDEPENDENT_AMBULATORY_CARE_PROVIDER_SITE_OTHER): Payer: BC Managed Care – PPO | Admitting: Physician Assistant

## 2019-02-24 ENCOUNTER — Other Ambulatory Visit: Payer: Self-pay

## 2019-02-24 VITALS — BP 136/88 | HR 78 | Ht 72.0 in | Wt 250.0 lb

## 2019-02-24 DIAGNOSIS — E7800 Pure hypercholesterolemia, unspecified: Secondary | ICD-10-CM

## 2019-02-24 DIAGNOSIS — Z8249 Family history of ischemic heart disease and other diseases of the circulatory system: Secondary | ICD-10-CM

## 2019-02-24 MED ORDER — ROSUVASTATIN CALCIUM 10 MG PO TABS: 20.0000 mg | ORAL_TABLET | Freq: Every day | ORAL | 3 refills | Status: DC

## 2019-02-24 NOTE — Patient Instructions (Addendum)
Medication Instructions:  Your physician recommends that you continue on your current medications as directed. Please refer to the Current Medication list given to you today.  If you need a refill on your cardiac medications before your next appointment, please call your pharmacy.   Lab work: CMET, Lipid Panel, TSH, CBC  If you have labs (blood work) drawn today and your tests are completely normal, you will receive your results only by: Marland Kitchen MyChart Message (if you have MyChart) OR . A paper copy in the mail If you have any lab test that is abnormal or we need to change your treatment, we will call you to review the results.  Testing/Procedures:  None ordered today  Follow-Up: At [facility], you and your health needs are our priority.  As part of our continuing mission to provide you with exceptional heart care, we have created designated Provider Care Teams.  These Care Teams include your primary Cardiologist (physician) and Advanced Practice Providers (APPs -  Physician Assistants and Nurse Practitioners) who all work together to provide you with the care you need, when you need it. You will need a follow up appointment in 12 months.  Please call our office 2 months in advance to schedule this appointment.  You may see Ena Dawley, MD or one of the following Advanced Practice Providers on your designated Care Team:   [place], PA-C Melina Copa, PA-C . Ermalinda Barrios, PA-C  Any Other Special Instructions Will Be Listed Below (If Applicable).  Your provider recommends that you maintain 150 minutes per week of moderate aerobic activity.

## 2019-02-25 ENCOUNTER — Other Ambulatory Visit: Payer: Self-pay

## 2019-02-25 ENCOUNTER — Other Ambulatory Visit: Payer: BC Managed Care – PPO | Admitting: *Deleted

## 2019-02-25 DIAGNOSIS — Z8249 Family history of ischemic heart disease and other diseases of the circulatory system: Secondary | ICD-10-CM

## 2019-02-25 DIAGNOSIS — E7800 Pure hypercholesterolemia, unspecified: Secondary | ICD-10-CM

## 2019-02-25 LAB — CBC
Hematocrit: 48.9 % (ref 37.5–51.0)
Hemoglobin: 16.5 g/dL (ref 13.0–17.7)
MCH: 28.9 pg (ref 26.6–33.0)
MCHC: 33.7 g/dL (ref 31.5–35.7)
MCV: 86 fL (ref 79–97)
Platelets: 196 10*3/uL (ref 150–450)
RBC: 5.71 x10E6/uL (ref 4.14–5.80)
RDW: 13.2 % (ref 11.6–15.4)
WBC: 6.2 10*3/uL (ref 3.4–10.8)

## 2019-02-25 LAB — COMPREHENSIVE METABOLIC PANEL
ALT: 44 IU/L (ref 0–44)
AST: 29 IU/L (ref 0–40)
Albumin/Globulin Ratio: 2.3 — ABNORMAL HIGH (ref 1.2–2.2)
Albumin: 4.3 g/dL (ref 3.8–4.9)
Alkaline Phosphatase: 51 IU/L (ref 39–117)
BUN/Creatinine Ratio: 13 (ref 9–20)
BUN: 13 mg/dL (ref 6–24)
Bilirubin Total: 0.6 mg/dL (ref 0.0–1.2)
CO2: 23 mmol/L (ref 20–29)
Calcium: 9.4 mg/dL (ref 8.7–10.2)
Chloride: 103 mmol/L (ref 96–106)
Creatinine, Ser: 0.98 mg/dL (ref 0.76–1.27)
GFR calc Af Amer: 99 mL/min/{1.73_m2} (ref >59)
GFR calc non Af Amer: 85 mL/min/{1.73_m2} (ref >59)
Globulin, Total: 1.9 g/dL (ref 1.5–4.5)
Glucose: 97 mg/dL (ref 65–99)
Potassium: 4.3 mmol/L (ref 3.5–5.2)
Sodium: 141 mmol/L (ref 134–144)
Total Protein: 6.2 g/dL (ref 6.0–8.5)

## 2019-02-25 LAB — LIPID PANEL
Chol/HDL Ratio: 4.1 ratio (ref 0.0–5.0)
Cholesterol, Total: 152 mg/dL (ref 100–199)
HDL: 37 mg/dL — ABNORMAL LOW (ref >39)
LDL Calculated: 100 mg/dL — ABNORMAL HIGH (ref 0–99)
Triglycerides: 76 mg/dL (ref 0–149)
VLDL Cholesterol Cal: 15 mg/dL (ref 5–40)

## 2019-02-25 LAB — TSH: TSH: 2.35 u[IU]/mL (ref 0.450–4.500)

## 2019-03-24 ENCOUNTER — Telehealth: Payer: Self-pay | Admitting: Cardiology

## 2019-03-24 DIAGNOSIS — Z8249 Family history of ischemic heart disease and other diseases of the circulatory system: Secondary | ICD-10-CM

## 2019-03-24 DIAGNOSIS — E7800 Pure hypercholesterolemia, unspecified: Secondary | ICD-10-CM

## 2019-03-24 DIAGNOSIS — E785 Hyperlipidemia, unspecified: Secondary | ICD-10-CM

## 2019-03-24 MED ORDER — ROSUVASTATIN CALCIUM 40 MG PO TABS: 40.0000 mg | ORAL_TABLET | Freq: Every day | ORAL | 2 refills | Status: DC

## 2019-03-24 NOTE — Telephone Encounter (Signed)
Notes recorded by Imogene Burn, PA-C on 03/01/2019 at 8:54 AM EDT  LDL over 100. With family history of early CAD would try to get close to 65. Increase crestor ot 40 mg daily.He has 10 mg tablets. All other labs stable. Repeat flp and lft's in 3 months   Called the pt and spoke to him about his lab results and recommendations per Estella Husk PA-C.  Confirmed the pharmacy of choice with the pt.  Scheduled the pt for repeat lipids and LFTs in 3 months on 06/25/19 at 0800.  Pt is aware to come fasting.  Pt verbalized understanding and agrees with this plan.

## 2019-03-24 NOTE — Telephone Encounter (Signed)
Patient returning call for lab results. 

## 2019-06-25 ENCOUNTER — Other Ambulatory Visit: Payer: BC Managed Care – PPO | Admitting: *Deleted

## 2019-06-25 ENCOUNTER — Other Ambulatory Visit: Payer: Self-pay

## 2019-06-25 DIAGNOSIS — E785 Hyperlipidemia, unspecified: Secondary | ICD-10-CM

## 2019-06-25 DIAGNOSIS — Z8249 Family history of ischemic heart disease and other diseases of the circulatory system: Secondary | ICD-10-CM

## 2019-06-25 DIAGNOSIS — E7800 Pure hypercholesterolemia, unspecified: Secondary | ICD-10-CM

## 2019-06-25 LAB — HEPATIC FUNCTION PANEL
ALT: 26 IU/L (ref 0–44)
AST: 20 IU/L (ref 0–40)
Albumin: 4.1 g/dL (ref 3.8–4.9)
Alkaline Phosphatase: 59 IU/L (ref 39–117)
Bilirubin Total: 0.5 mg/dL (ref 0.0–1.2)
Bilirubin, Direct: 0.16 mg/dL (ref 0.00–0.40)
Total Protein: 6.1 g/dL (ref 6.0–8.5)

## 2019-06-25 LAB — LIPID PANEL
Chol/HDL Ratio: 3.3 ratio (ref 0.0–5.0)
Cholesterol, Total: 109 mg/dL (ref 100–199)
HDL: 33 mg/dL — ABNORMAL LOW (ref >39)
LDL Chol Calc (NIH): 64 mg/dL (ref 0–99)
Triglycerides: 51 mg/dL (ref 0–149)
VLDL Cholesterol Cal: 12 mg/dL (ref 5–40)

## 2020-01-13 ENCOUNTER — Other Ambulatory Visit: Payer: Self-pay | Admitting: Physician Assistant

## 2020-01-13 DIAGNOSIS — Z8249 Family history of ischemic heart disease and other diseases of the circulatory system: Secondary | ICD-10-CM

## 2020-01-13 DIAGNOSIS — E7800 Pure hypercholesterolemia, unspecified: Secondary | ICD-10-CM

## 2020-01-13 DIAGNOSIS — E785 Hyperlipidemia, unspecified: Secondary | ICD-10-CM

## 2020-01-13 MED ORDER — ROSUVASTATIN CALCIUM 40 MG PO TABS: 40.0000 mg | ORAL_TABLET | Freq: Every day | ORAL | 0 refills | Status: DC

## 2020-04-05 ENCOUNTER — Other Ambulatory Visit: Payer: Self-pay | Admitting: Physician Assistant

## 2020-06-02 ENCOUNTER — Ambulatory Visit (HOSPITAL_COMMUNITY)
Admission: RE | Admit: 2020-06-02 | Discharge: 2020-06-02 | Disposition: A | Discharge status: Home / Self Care | Payer: BC Managed Care – PPO | Source: Ambulatory Visit | Attending: Pulmonary Disease | Admitting: Pulmonary Disease

## 2020-06-02 ENCOUNTER — Telehealth: Payer: Self-pay | Admitting: Infectious Diseases

## 2020-06-02 ENCOUNTER — Other Ambulatory Visit: Payer: Self-pay | Admitting: Infectious Diseases

## 2020-06-02 DIAGNOSIS — E7800 Pure hypercholesterolemia, unspecified: Secondary | ICD-10-CM | POA: Diagnosis present

## 2020-06-02 DIAGNOSIS — E669 Obesity, unspecified: Secondary | ICD-10-CM

## 2020-06-02 DIAGNOSIS — U071 COVID-19: Secondary | ICD-10-CM | POA: Diagnosis present

## 2020-06-02 MED ORDER — EPINEPHRINE 0.3 MG/0.3ML IJ SOAJ: 0.3000 mg | Freq: Once | INTRAMUSCULAR | Status: DC | PRN

## 2020-06-02 MED ORDER — ALBUTEROL SULFATE HFA 108 (90 BASE) MCG/ACT IN AERS: 2.0000 | INHALATION_SPRAY | Freq: Once | RESPIRATORY_TRACT | Status: DC | PRN

## 2020-06-02 MED ORDER — FAMOTIDINE IN NACL 20-0.9 MG/50ML-% IV SOLN: 20.0000 mg | Freq: Once | INTRAVENOUS | Status: DC | PRN

## 2020-06-02 MED ORDER — SODIUM CHLORIDE 0.9 % IV SOLN: INTRAVENOUS | Status: DC | PRN

## 2020-06-02 MED ORDER — DIPHENHYDRAMINE HCL 50 MG/ML IJ SOLN: 50.0000 mg | Freq: Once | INTRAMUSCULAR | Status: DC | PRN

## 2020-06-02 MED ORDER — METHYLPREDNISOLONE SODIUM SUCC 125 MG IJ SOLR: 125.0000 mg | Freq: Once | INTRAMUSCULAR | Status: DC | PRN

## 2020-06-02 MED ORDER — SODIUM CHLORIDE 0.9 % IV SOLN: Freq: Once | INTRAVENOUS | Status: AC

## 2020-06-02 NOTE — Progress Notes (Signed)
  Diagnosis: COVID-19  Physician: Dr. Asencion Noble  Procedure: Covid Infusion Clinic Med: casirivimab\imdevimab infusion - Provided patient with casirivimab\imdevimab fact sheet for patients, parents and caregivers prior to infusion.  Complications: No immediate complications noted.  Discharge: Discharged home   Gaye Alken 06/02/2020

## 2020-06-02 NOTE — Telephone Encounter (Signed)
Called to Discuss with patient about Covid symptoms and the use of the monoclonal antibody infusion for those with mild to moderate Covid symptoms and at a high risk of hospitalization.     Pt appears to qualify for this infusion due to co-morbid conditions and/or a member of an at-risk group in accordance with the FDA Emergency Use Authorization.    I spoke to Wesley Wise his wife to follow-up on self-referral on our hotline.  He has been ill with Covid symptoms with today being day 10 day.  He has since gone on to develop GI symptoms in addition to mild/moderate respiratory symptoms.  He is qualified for the infusion based off of BMI 33, smoking history.    Janene Madeira, MSN, NP-C [facility] [place] [facility]

## 2020-06-02 NOTE — Progress Notes (Signed)
I connected by phone with Wesley Wise on 06/02/2020 at 12:08 PM to discuss the potential use of a new treatment for mild to moderate COVID-19 viral infection in non-hospitalized patients.  This patient is a 59 y.o. male that meets the FDA criteria for Emergency Use Authorization of COVID monoclonal antibody casirivimab/imdevimab or bamlanivimab/eteseviamb.  Has a (+) direct SARS-CoV-2 viral test result  Has mild or moderate COVID-19   Is NOT hospitalized due to COVID-19  Is within 10 days of symptom onset  Has at least one of the high risk factor(s) for progression to severe COVID-19 and/or hospitalization as defined in EUA.  Specific high risk criteria : BMI > 25 and Other high risk medical condition per CDC:  Previous smoking history   I have spoken and communicated the following to the patient or parent/caregiver regarding COVID monoclonal antibody treatment:  1. FDA has authorized the emergency use for the treatment of mild to moderate COVID-19 in adults and pediatric patients with positive results of direct SARS-CoV-2 viral testing who are 60 years of age and older weighing at least 40 kg, and who are at high risk for progressing to severe COVID-19 and/or hospitalization.  2. The significant known and potential risks and benefits of COVID monoclonal antibody, and the extent to which such potential risks and benefits are unknown.  3. Information on available alternative treatments and the risks and benefits of those alternatives, including clinical trials.  4. Patients treated with COVID monoclonal antibody should continue to self-isolate and use infection control measures (e.g., wear mask, isolate, social distance, avoid sharing personal items, clean and disinfect "high touch" surfaces, and frequent handwashing) according to CDC guidelines.   5. The patient or parent/caregiver has the option to accept or refuse COVID monoclonal antibody treatment.  After reviewing this  information with the patient, the patient has agreed to receive one of the available covid 19 monoclonal antibodies and will be provided an appropriate fact sheet prior to infusion. Janene Madeira, NP 06/02/2020 12:08 PM

## 2020-06-02 NOTE — Discharge Instructions (Signed)

## 2021-04-17 ENCOUNTER — Encounter (HOSPITAL_BASED_OUTPATIENT_CLINIC_OR_DEPARTMENT_OTHER): Payer: Self-pay

## 2021-04-17 ENCOUNTER — Telehealth: Payer: Self-pay | Admitting: Family

## 2021-04-17 NOTE — Telephone Encounter (Signed)
Pt also sent mychart message. Advised via [employer]

## 2021-04-17 NOTE — Progress Notes (Signed)
Office Visit    Patient Name: Wesley Wise Date of Encounter: 04/18/2021  PCP:  Aletha Halim PA-C   [place]  Cardiologist:  Freada Bergeron, MD  Advanced Practice Provider:  No care team member to display Electrophysiologist:  None      Chief Complaint    Wesley Wise is a 60 y.o. male with a hx of hyperlipidemia, coronary calcification on CT scan, family history of CAD presents today for follow-up of CAD  Past Medical History    Past Medical History:  Diagnosis Date   BPH (benign prostatic hyperplasia)    HOH (hard of hearing)    hearing aid to left ear   Pure hypercholesterolemia    Past Surgical History:  Procedure Laterality Date   TRANSURETHRAL RESECTION OF PROSTATE N/A 08/23/2016   Procedure: TRANSURETHRAL RESECTION OF THE PROSTATE (TURP);  Surgeon: Kathie Rhodes, MD;  Location: [facility];  Service: Urology;  Laterality: N/A;    Allergies  No Known Allergies  History of Present Illness    Wesley Wise is a 60 y.o. male with a hx of hyperlipidemia, coronary calcification on CT, family history of CAD last seen 02/24/2019 by Estella Husk, PA.  Significant family history of CAD with multiple siblings having had MIs.  His father and 2 brothers with CABG, sister with stents, and 3 older brothers without cardiac history.  He had a calcium score in 2017 of 171 and nuclear stress test at that time which was negative for ischemia.  He was last seen 02/2019 via telemedicine and doing well from a cardiac perspective. He was treated for COVID-13 June 2020 with monoclonal antibody infusion.  He presents today for follow-up. Reports feeling overall well since being seen last. 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 Reports no regular exercise regimen. He has 8 grandchildren who he enjoys spending time with.    EKGs/Labs/Other Studies Reviewed:   The following studies were reviewed today:  EKG:  EKG is ordered today.  The ekg ordered today demonstrates NSR 60 bpm with LAB and no acute ST/T wave changes.   Recent Labs: No results found for requested labs within last 8760 hours.  Recent Lipid Panel    Component Value Date/Time   CHOL 109 06/25/2019 0805   CHOL 161 09/02/2014 1541   TRIG 51 06/25/2019 0805   TRIG 76 06/24/2017 1440   TRIG 182 (H) 09/02/2014 1541   HDL 33 (L) 06/25/2019 0805   HDL 33 (L) 06/24/2017 1440   HDL 28 (L) 09/02/2014 1541   CHOLHDL 3.3 06/25/2019 0805   CHOLHDL 3.7 10/30/2015 0904   VLDL 13 10/30/2015 0904   LDLCALC 64 06/25/2019 0805   LDLCALC 97 09/02/2014 1541    Home Medications   Current Meds  Medication Sig   aspirin 81 MG tablet Take 81 mg by mouth daily.   rosuvastatin (CRESTOR) 40 MG tablet Take 1 tablet (40 mg total) by mouth daily. Please make yearly appt with Dr. Meda Coffee for July before anymore refills. 1st attempt     Review of Systems      All other systems reviewed and are otherwise negative except as noted above.  Physical Exam    VS:  BP 120/80 (BP Location: Left Arm, Patient Position: Sitting, Cuff Size: Normal)   Pulse 60   Ht 6' (1.829  m)   Wt 260 lb (117.9 kg)   SpO2 97%   BMI 35.26 kg/m  , BMI Body mass index is 35.26 kg/m.  Wt Readings from Last 3 Encounters:  04/18/21 260 lb (117.9 kg)  02/22/19 250 lb (113.4 kg)  06/24/17 255 lb (115.7 kg)     GEN: Well nourished, well developed, in no acute distress. HEENT: normal. Neck: Supple, no JVD, carotid bruits, or masses. Cardiac: RRR, no murmurs, rubs, or gallops. No clubbing, cyanosis, edema.  Radials/PT 2+ and equal bilaterally.  Respiratory:  Respirations regular and unlabored, clear to auscultation bilaterally. GI: Soft, nontender, nondistended. MS: No deformity or atrophy. Skin: Warm and dry, no rash. Neuro:  Strength and sensation are intact. Psych: Normal  affect.  Assessment & Plan    CAD / Family history of CAD - Stable with no anginal symptoms. No indication for ischemic evaluation. Continue Aspirin, Crestor. Heart healthy diet and regular cardiovascular exercise encouraged.  CMP, CBC ordered today.   Hyperlipidemia - LDL goal <70. FLP, CMP ordered today. Refill of Rosuvastatin '40mg'$  daily provided today. Does note having been out for 2 weeks, still wishes to check FLP today and we discussed if LDL not at goal of <70 we will plan to recheck in a few months.   Disposition: Follow up in 1 year(s) with Dr. Johney Frame or APP.  Signed, Loel Dubonnet, NP 04/18/2021, 9:44 AM [place]

## 2021-04-17 NOTE — Telephone Encounter (Signed)
   Pt is calling to follow up blood work concern he sent thru [facility]. He wants to know if he will get blood work tomorrow at his appt,.

## 2021-04-18 ENCOUNTER — Other Ambulatory Visit: Payer: Self-pay

## 2021-04-18 ENCOUNTER — Encounter (HOSPITAL_BASED_OUTPATIENT_CLINIC_OR_DEPARTMENT_OTHER): Payer: Self-pay | Admitting: Family

## 2021-04-18 ENCOUNTER — Ambulatory Visit (INDEPENDENT_AMBULATORY_CARE_PROVIDER_SITE_OTHER): Payer: BC Managed Care – PPO | Admitting: Family

## 2021-04-18 VITALS — BP 120/80 | HR 60 | Ht 72.0 in | Wt 260.0 lb

## 2021-04-18 DIAGNOSIS — Z8249 Family history of ischemic heart disease and other diseases of the circulatory system: Secondary | ICD-10-CM | POA: Diagnosis not present

## 2021-04-18 DIAGNOSIS — I25118 Atherosclerotic heart disease of native coronary artery with other forms of angina pectoris: Secondary | ICD-10-CM | POA: Diagnosis not present

## 2021-04-18 DIAGNOSIS — E7800 Pure hypercholesterolemia, unspecified: Secondary | ICD-10-CM | POA: Diagnosis not present

## 2021-04-18 DIAGNOSIS — E785 Hyperlipidemia, unspecified: Secondary | ICD-10-CM | POA: Diagnosis not present

## 2021-04-18 MED ORDER — ROSUVASTATIN CALCIUM 40 MG PO TABS: 40.0000 mg | ORAL_TABLET | Freq: Every day | ORAL | 3 refills | Status: DC

## 2021-04-18 NOTE — Patient Instructions (Addendum)
Medication Instructions:  Continue your current medications.  We have sent a refill of your Rosuvastatin to your pharmacy.   *If you need a refill on your cardiac medications before your next appointment, please call your pharmacy*   Lab Work: Your physician recommends that you return for lab work today at [facility]: fasting lipid panel, CMP, CBC  If you have labs (blood work) drawn today and your tests are completely normal, you will receive your results only by: [place] (if you have [place]) OR A paper copy in the mail If you have any lab test that is abnormal or we need to change your treatment, we will call you to review the results.  Testing/Procedures: Your EKG today showed normal sinus rhythm which is a great result.  Follow-Up: At [facility], you and your health needs are our priority.  As part of our continuing mission to provide you with exceptional heart care, we have created designated Provider Care Teams.  These Care Teams include your primary Cardiologist (physician) and Advanced Practice Providers (APPs -  Physician Assistants and Nurse Practitioners) who all work together to provide you with the care you need, when you need it.  We recommend signing up for the patient portal called "MyChart".  Sign up information is provided on this After Visit Summary.  MyChart is used to connect with patients for Virtual Visits (Telemedicine).  Patients are able to view lab/test results, encounter notes, upcoming appointments, etc.  Non-urgent messages can be sent to your provider as well.   To learn more about what you can do with MyChart, go to [email or web address].    Your next appointment:   1 year(s)  The format for your next appointment:   In Person  Provider:   Gwyndolyn Kaufman, MD   Other Instructions  Heart Healthy Diet Recommendations: A low-salt diet is recommended. Meats should be grilled, baked, or boiled. Avoid fried foods. Focus on lean protein  sources like fish or chicken with vegetables and fruits. The American Heart Association is a Microbiologist!    Exercise recommendations: The American Heart Association recommends 150 minutes of moderate intensity exercise weekly. Try 30 minutes of moderate intensity exercise 4-5 times per week. This could include walking, jogging, or swimming.    Heart-Healthy Eating Plan Many factors influence your heart (coronary) health, including eating and exercise habits. Coronary risk increases with abnormal blood fat (lipid) levels. Heart-healthy meal planning includes limiting unhealthy fats,increasing healthy fats, and making other diet and lifestyle changes. What are tips for following this plan? Cooking Cook foods using methods other than frying. Baking, boiling, grilling, and broiling are all good options. Other ways to reduce fat include: Removing the skin from poultry. Removing all visible fats from meats. Steaming vegetables in water or broth. Meal planning  At meals, imagine dividing your plate into fourths: Fill one-half of your plate with vegetables and green salads. Fill one-fourth of your plate with whole grains. Fill one-fourth of your plate with lean protein foods. Eat 4-5 servings of vegetables per day. One serving equals 1 cup raw or cooked vegetable, or 2 cups raw leafy greens. Eat 4-5 servings of fruit per day. One serving equals 1 medium whole fruit,  cup dried fruit,  cup fresh, frozen, or canned fruit, or  cup 100% fruit juice. Eat more foods that contain soluble fiber. Examples include apples, broccoli, carrots, beans, peas, and barley. Aim to get 25-30 g of fiber per day. Increase your consumption of legumes, nuts, and seeds  to 4-5 servings per week. One serving of dried beans or legumes equals  cup cooked, 1 serving of nuts is  cup, and 1 serving of seeds equals 1 tablespoon.  Fats Choose healthy fats more often. Choose monounsaturated and polyunsaturated fats, such  as olive and canola oils, flaxseeds, walnuts, almonds, and seeds. Eat more omega-3 fats. Choose salmon, mackerel, sardines, tuna, flaxseed oil, and ground flaxseeds. Aim to eat fish at least 2 times each week. Check food labels carefully to identify foods with trans fats or high amounts of saturated fat. Limit saturated fats. These are found in animal products, such as meats, butter, and cream. Plant sources of saturated fats include palm oil, palm kernel oil, and coconut oil. Avoid foods with partially hydrogenated oils in them. These contain trans fats. Examples are stick margarine, some tub margarines, cookies, crackers, and other baked goods. Avoid fried foods. General information Eat more home-cooked food and less restaurant, buffet, and fast food. Limit or avoid alcohol. Limit foods that are high in starch and sugar. Lose weight if you are overweight. Losing just 5-10% of your body weight can help your overall health and prevent diseases such as diabetes and heart disease. Monitor your salt (sodium) intake, especially if you have high blood pressure. Talk with your health care provider about your sodium intake. Try to incorporate more vegetarian meals weekly. What foods can I eat? Fruits All fresh, canned (in natural juice), or frozen fruits. Vegetables Fresh or frozen vegetables (raw, steamed, roasted, or grilled). Green salads. Grains Most grains. Choose whole wheat and whole grains most of the time. Rice andpasta, including brown rice and pastas made with whole wheat. Meats and other proteins Lean, well-trimmed beef, veal, pork, and lamb. Chicken and [place] without skin. All fish and shellfish. Wild duck, rabbit, pheasant, and venison. Egg whites or low-cholesterol egg substitutes. Dried beans, peas, lentils, and tofu. Seedsand most nuts. Dairy Low-fat or nonfat cheeses, including ricotta and mozzarella. Skim or 1% milk (liquid, powdered, or evaporated). Buttermilk made with low-fat  milk. Nonfat orlow-fat yogurt. Fats and oils Non-hydrogenated (trans-free) margarines. Vegetable oils, including soybean, sesame, sunflower, olive, peanut, safflower, corn, canola, and cottonseed. Salad dressings or mayonnaisemade with a vegetable oil. Beverages Water (mineral or sparkling). Coffee and tea. Diet carbonated beverages. Sweets and desserts Sherbet, gelatin, and fruit ice. Small amounts of dark chocolate. Limit all sweets and desserts. Seasonings and condiments All seasonings and condiments. The items listed above may not be a complete list of foods and beverages you can eat. Contact a dietitian for more options. What foods are not recommended? Fruits Canned fruit in heavy syrup. Fruit in cream or butter sauce. Fried fruit. Limitcoconut. Vegetables Vegetables cooked in cheese, cream, or butter sauce. Fried vegetables. Grains Breads made with saturated or trans fats, oils, or whole milk. Croissants. Sweet rolls. Donuts. High-fat crackers,such as cheese crackers. Meats and other proteins Fatty meats, such as hot dogs, ribs, sausage, bacon, rib-eye roast or steak. High-fat deli meats, such as salami and bologna. Caviar. Domestic duck andgoose. Organ meats, such as liver. Dairy Cream, sour cream, cream cheese, and creamed cottage cheese. Whole milk cheeses. Whole or 2% milk (liquid, evaporated, or condensed). Whole buttermilk.Cream sauce or high-fat cheese sauce. Whole-milk yogurt. Fats and oils Meat fat, or shortening. Cocoa butter, hydrogenated oils, palm oil, coconut oil, palm kernel oil. Solid fats and shortenings, including bacon fat, salt pork, lard, and butter. Nondairy cream substitutes. Salad dressings with cheeseor sour cream. Beverages Regular sodas and any drinks with  added sugar. Sweets and desserts Frosting. Pudding. Cookies. Cakes. Pies. Milk chocolate or white chocolate.Buttered syrups. Full-fat ice cream or ice cream drinks. The items listed above may not be a  complete list of foods and beverages to avoid. Contact a dietitian for more information. Summary Heart-healthy meal planning includes limiting unhealthy fats, increasing healthy fats, and making other diet and lifestyle changes. Lose weight if you are overweight. Losing just 5-10% of your body weight can help your overall health and prevent diseases such as diabetes and heart disease. Focus on eating a balance of foods, including fruits and vegetables, low-fat or nonfat dairy, lean protein, nuts and legumes, whole grains, and heart-healthy oils and fats. This information is not intended to replace advice given to you by your health care provider. Make sure you discuss any questions you have with your healthcare provider. Document Revised: 09/19/2017 Document Reviewed: 09/19/2017 Elsevier Patient Education  2022 [employer].

## 2021-04-19 LAB — COMPREHENSIVE METABOLIC PANEL
ALT: 68 IU/L — ABNORMAL HIGH (ref 0–44)
AST: 39 IU/L (ref 0–40)
Albumin/Globulin Ratio: 2.1 (ref 1.2–2.2)
Albumin: 4.5 g/dL (ref 3.8–4.9)
Alkaline Phosphatase: 60 IU/L (ref 44–121)
BUN/Creatinine Ratio: 13 (ref 10–24)
BUN: 13 mg/dL (ref 8–27)
Bilirubin Total: 0.7 mg/dL (ref 0.0–1.2)
CO2: 22 mmol/L (ref 20–29)
Calcium: 8.8 mg/dL (ref 8.6–10.2)
Chloride: 107 mmol/L — ABNORMAL HIGH (ref 96–106)
Creatinine, Ser: 1 mg/dL (ref 0.76–1.27)
Globulin, Total: 2.1 g/dL (ref 1.5–4.5)
Glucose: 102 mg/dL — ABNORMAL HIGH (ref 65–99)
Potassium: 4.6 mmol/L (ref 3.5–5.2)
Sodium: 143 mmol/L (ref 134–144)
Total Protein: 6.6 g/dL (ref 6.0–8.5)
eGFR: 86 mL/min/{1.73_m2} (ref >59)

## 2021-04-19 LAB — CBC
Hematocrit: 49.7 % (ref 37.5–51.0)
Hemoglobin: 16.8 g/dL (ref 13.0–17.7)
MCH: 28.3 pg (ref 26.6–33.0)
MCHC: 33.8 g/dL (ref 31.5–35.7)
MCV: 84 fL (ref 79–97)
Platelets: 198 10*3/uL (ref 150–450)
RBC: 5.93 x10E6/uL — ABNORMAL HIGH (ref 4.14–5.80)
RDW: 13.1 % (ref 11.6–15.4)
WBC: 5.4 10*3/uL (ref 3.4–10.8)

## 2021-04-19 LAB — LIPID PANEL
Chol/HDL Ratio: 5.3 ratio — ABNORMAL HIGH (ref 0.0–5.0)
Cholesterol, Total: 175 mg/dL (ref 100–199)
HDL: 33 mg/dL — ABNORMAL LOW (ref >39)
LDL Chol Calc (NIH): 125 mg/dL — ABNORMAL HIGH (ref 0–99)
Triglycerides: 94 mg/dL (ref 0–149)
VLDL Cholesterol Cal: 17 mg/dL (ref 5–40)

## 2021-04-20 ENCOUNTER — Telehealth (HOSPITAL_BASED_OUTPATIENT_CLINIC_OR_DEPARTMENT_OTHER): Payer: Self-pay | Admitting: Family

## 2021-04-20 DIAGNOSIS — E785 Hyperlipidemia, unspecified: Secondary | ICD-10-CM

## 2021-04-20 NOTE — Telephone Encounter (Signed)
-----   Message from Loel Dubonnet, NP sent at 04/19/2021 12:46 PM EDT ----- Lab work shows normal kidney function.  CBC with no evidence of anemia or infection.  One of the liver enzymes is mildly elevated.  Recommend avoidance of Tylenol and alcohol.  LDL not at goal of less than 70.  Anticipate this is due to missing 2 weeks of his rosuvastatin.  Recommend repeat FLP/LFT in 8 weeks.

## 2021-04-20 NOTE — Telephone Encounter (Signed)
Discussed with patient. Agreeable to reduce etoh intake. Does not use tylenol. Will pick up Crestor today. Agreeable to repeat FLP/CMP at [facility] in 2 mos. Will mail lab slips.   Wesley Dubonnet, NP

## 2022-02-07 ENCOUNTER — Ambulatory Visit (INDEPENDENT_AMBULATORY_CARE_PROVIDER_SITE_OTHER): Payer: BC Managed Care – PPO | Admitting: Physician Assistant

## 2022-02-07 DIAGNOSIS — L570 Actinic keratosis: Secondary | ICD-10-CM

## 2022-02-07 DIAGNOSIS — L820 Inflamed seborrheic keratosis: Secondary | ICD-10-CM | POA: Diagnosis not present

## 2022-02-07 DIAGNOSIS — D485 Neoplasm of uncertain behavior of skin: Secondary | ICD-10-CM | POA: Diagnosis not present

## 2022-02-07 DIAGNOSIS — Z1283 Encounter for screening for malignant neoplasm of skin: Secondary | ICD-10-CM | POA: Diagnosis not present

## 2022-02-07 NOTE — Patient Instructions (Signed)

## 2022-02-12 ENCOUNTER — Telehealth: Payer: Self-pay

## 2022-02-12 NOTE — Telephone Encounter (Signed)
Left message with path report and any further questions call the office

## 2022-02-12 NOTE — Telephone Encounter (Signed)
-----   Message from Warren Danes, [place] sent at 02/12/2022  3:37 PM EDT ----- Benign. Discontinue picking.

## 2022-02-14 ENCOUNTER — Encounter: Payer: Self-pay | Admitting: Physician Assistant

## 2022-02-14 NOTE — Progress Notes (Signed)
   Follow-Up Visit   Subjective  Wesley Wise is a 61 y.o. male who presents for the following: Annual Exam (Here for annual skin check. Concerns left forearm. Crusty lesion x 6 months. Not healing. ).   The following portions of the chart were reviewed this encounter and updated as appropriate:  Tobacco  Allergies  Meds  Problems  Med Hx  Surg Hx  Fam Hx      Objective  Well appearing patient in no apparent distress; mood and affect are within normal limits.  All skin waist up examined.  Full body skin exam.  Left Forearm - Posterior Volcano growth on pink base        Head - Anterior (Face) Erythematous patches with gritty scale.   Assessment & Plan  Neoplasm of uncertain behavior of skin Left Forearm - Posterior  Skin / nail biopsy Type of biopsy: tangential   Informed consent: discussed and consent obtained   Timeout: patient name, date of birth, surgical site, and procedure verified   Anesthesia: the lesion was anesthetized in a standard fashion   Anesthetic:  1% lidocaine w/ epinephrine 1-100,000 local infiltration Instrument used: flexible razor blade   Hemostasis achieved with: ferric subsulfate   Outcome: patient tolerated procedure well   Post-procedure details: wound care instructions given    Destruction of lesion Complexity: simple   Destruction method: electrodesiccation and curettage   Informed consent: discussed and consent obtained   Timeout:  patient name, date of birth, surgical site, and procedure verified Anesthesia: the lesion was anesthetized in a standard fashion   Anesthetic:  1% lidocaine w/ epinephrine 1-100,000 local infiltration Curettage performed in three different directions: Yes   Curettage cycles:  3 Margin per side (cm):  0.1 Final wound size (cm):  1.5 Hemostasis achieved with:  aluminum chloride Outcome: patient tolerated procedure well with no complications   Post-procedure details: wound care instructions given     Specimen 1 - Surgical pathology Differential Diagnosis: scc vs bcc txpbx Check Margins: No  Screening for malignant neoplasm of skin  Yearly skin exam.  Inflamed seborrheic keratosis (5) Mid Back  Destruction of lesion - Mid Back Complexity: simple   Destruction method: cryotherapy   Informed consent: discussed and consent obtained   Timeout:  patient name, date of birth, surgical site, and procedure verified Lesion destroyed using liquid nitrogen: Yes   Outcome: patient tolerated procedure well with no complications    AK (actinic keratosis) Head - Anterior (Face)  Destruction of lesion - Head - Anterior (Face) Complexity: simple   Destruction method: cryotherapy   Informed consent: discussed and consent obtained   Timeout:  patient name, date of birth, surgical site, and procedure verified Lesion destroyed using liquid nitrogen: Yes   Outcome: patient tolerated procedure well with no complications      I, Raelea Gosse, PA-C, have reviewed all documentation's for this visit.  The documentation on 02/14/22 for the exam, diagnosis, procedures and orders are all accurate and complete.

## 2022-04-23 ENCOUNTER — Other Ambulatory Visit (HOSPITAL_BASED_OUTPATIENT_CLINIC_OR_DEPARTMENT_OTHER): Payer: Self-pay | Admitting: Family

## 2022-04-23 DIAGNOSIS — Z8249 Family history of ischemic heart disease and other diseases of the circulatory system: Secondary | ICD-10-CM

## 2022-04-23 DIAGNOSIS — E785 Hyperlipidemia, unspecified: Secondary | ICD-10-CM

## 2022-04-23 DIAGNOSIS — E7800 Pure hypercholesterolemia, unspecified: Secondary | ICD-10-CM

## 2022-04-23 NOTE — Telephone Encounter (Signed)
Pt of Dr. Johney Frame. Please review for refills. Thank you!  Last saw Laurann Montana, NP 03/2021. Overdue for follow-up.

## 2022-05-20 ENCOUNTER — Telehealth: Payer: Self-pay | Admitting: Cardiology

## 2022-05-20 DIAGNOSIS — E785 Hyperlipidemia, unspecified: Secondary | ICD-10-CM

## 2022-05-20 DIAGNOSIS — Z79899 Other long term (current) drug therapy: Secondary | ICD-10-CM

## 2022-05-20 DIAGNOSIS — E7800 Pure hypercholesterolemia, unspecified: Secondary | ICD-10-CM

## 2022-05-20 DIAGNOSIS — I25118 Atherosclerotic heart disease of native coronary artery with other forms of angina pectoris: Secondary | ICD-10-CM

## 2022-05-20 DIAGNOSIS — Z8249 Family history of ischemic heart disease and other diseases of the circulatory system: Secondary | ICD-10-CM

## 2022-05-20 NOTE — Telephone Encounter (Signed)
Spoke with the pt and wife.  Pt would like to have his labs done same day as he see's Dr. Johney Frame in clinic on 07/12/22 at [place].  Pt will come to the office early on 11/17 around 0800, to have labs done prior to seeing Dr. Johney Frame.    Pt will have Lipids, CMET, and CBC W DIFF done at that time.  Pt is aware to come fasting to this lab appt.   Both parties verbalized understanding and agrees with this plan.

## 2022-05-20 NOTE — Telephone Encounter (Signed)
Wife calling to see if the patient needs to get labs done before his appt on 11/17. Please advise

## 2022-05-20 NOTE — Telephone Encounter (Signed)
Orders for the pt to have lab work prior to his OV with Dr. Johney Frame on 11/17, placed.   Orders placed are lipids, CMET, and CBC W DIFF.   Tried calling the pts wife back x 2 and no answer.  Did leave her a detailed message about lab orders placed and that she can call the office back and inform the operator/scheduler which day the pt can come into the office to have his labs drawn prior to the 11/17 OV or same day as OV with Dr. Johney Frame.    Left her a detailed message that if they choose to have his labs done prior to the [place], then he should come on 11/15 to our office, and make sure he comes fasting, either date he chooses.    Left her a message to call the office back and endorse to the operator which lab date they prefer, so that they can make this appt.  Orders placed and ready for scheduling.

## 2022-07-10 NOTE — Progress Notes (Deleted)
Cardiology Office Note:    Date:  07/10/2022   ID:  Wesley Wise, DOB 10/16/1960, MRN [id number]  PCP:  Aletha Halim PA-C   [facility] Providers Cardiologist:  Freada Bergeron, MD {  Referring MD: Aletha Halim., PA-C    History of Present Illness:    Wesley Wise is a 61 y.o. male with a hx of hyperlipidemia, coronary calcification on CT scan, and family history of CAD who presents to clinic for follow-up.  Per review of the record, the patient has a significant family history of CAD with multiple siblings having had MIs.  His father and 2 brothers with CABG, sister with stents, and 3 older brothers without cardiac history.  Ca score in 2017 was 171 and nuclear stress test at that time which was negative for ischemia.  Was last seen by Laurann Montana, NP on 03/2021 where he was doing well from a CV standpoint.  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     Allergies:   Patient has no known allergies.   Social History   Socioeconomic History   Marital status: Married    Spouse name: Not on file   Number of children: Not on file   Years of education: Not on file   Highest education level: Not on file  Occupational History   Not on file  Tobacco Use   Smoking status: Former    Types: Cigarettes    Quit date: 08/20/2009    Years since quitting: 12.8   Smokeless tobacco: Never  Substance and  Sexual Activity   Alcohol use: Yes    Alcohol/week: 10.0 - 20.0 standard drinks of alcohol    Types: 10 - 20 Cans of beer per week   Drug use: No   Sexual activity: Not on file  Other Topics Concern   Not on file  Social History Narrative   Not on file   Social Determinants of Health   Financial Resource Strain: Not on file  Food Insecurity: Not on file  Transportation Needs: Not on file  Physical Activity: Not on file  Stress: Not on file  Social Connections: Not on file     Family History: The patient's ***family history includes CAD in his brother; Heart Problems (age of onset: 51) in his brother; Heart attack (age of onset: 92) in his father.  ROS:   Please see the history of present illness.    *** All other systems reviewed and are negative.  EKGs/Labs/Other Studies Reviewed:    The following studies were reviewed today: Ca Score 2017: FINDINGS: Non-cardiac: No significant non cardiac findings on limited lung and soft tissue windows. See separate report from [facility].   Ascending Aorta:  Normal caliber.   Pericardium: Normal   Coronary arteries:  Normal origin.   IMPRESSION: Coronary calcium score of 171. This was 60 percentile for age and sex matched control.  EKG:  EKG is *** ordered today.  The ekg ordered today demonstrates ***  Recent Labs: No results found for requested labs within last 365 days.  Recent Lipid Panel    Component Value Date/Time   CHOL 175 04/18/2021 1041   CHOL 161 09/02/2014 1541   TRIG 94 04/18/2021 1041   TRIG 76 06/24/2017 1440   TRIG 182 (H) 09/02/2014 1541   HDL 33 (L) 04/18/2021 1041   HDL 33 (L) 06/24/2017 1440   HDL 28 (L) 09/02/2014 1541   CHOLHDL 5.3 (H) 04/18/2021 1041   CHOLHDL 3.7 10/30/2015 0904   VLDL 13 10/30/2015 0904   LDLCALC 125 (H) 04/18/2021 1041   LDLCALC 97 09/02/2014 1541     Risk Assessment/Calculations:   {Does this patient have ATRIAL FIBRILLATION?[phone]}  No BP recorded.   {Refresh Note OR Click here to enter BP  :1}***         Physical Exam:    VS:  There were no vitals taken for this visit.    Wt Readings from Last 3 Encounters:  04/18/21 260 lb (117.9 kg)  02/22/19 250 lb (113.4 kg)  06/24/17 255 lb (115.7 kg)     GEN: *** Well nourished, well developed in no acute distress HEENT: Normal NECK: No JVD; No carotid bruits LYMPHATICS: No lymphadenopathy CARDIAC: ***RRR, no murmurs, rubs, gallops RESPIRATORY:  Clear to auscultation without rales, wheezing or rhonchi  ABDOMEN: Soft, non-tender, non-distended MUSCULOSKELETAL:  No edema; No deformity  SKIN: Warm and dry NEUROLOGIC:  Alert and oriented x 3 PSYCHIATRIC:  Normal affect   ASSESSMENT:    No diagnosis found. PLAN:    In order of problems listed above:  #Elevated Ca Score: Ca score elevated at 171 in 2017. Myoview negative for ischemia. Currently doing well without anginal symptoms.  -Continue crestor '40mg'$  daily -Continue ASA '81mg'$  daily  #HLD: -Continue crestor '40mg'$  daily -Repeat cholesterol for monitoring -Goal LDL<70     {Are you ordering a CV Procedure (e.g. stress test, cath, DCCV, TEE, etc)?   Press F2        :[id number]}    Medication Adjustments/Labs and Tests Ordered: Current medicines are reviewed at length with the patient today.  Concerns regarding medicines are outlined above.  No orders of the defined types were placed in this encounter.  No orders of the defined types were placed in this encounter.   There are no Patient Instructions on file for this visit.   Signed, Freada Bergeron, MD  07/10/2022 1:08 PM    [place]

## 2022-07-11 ENCOUNTER — Ambulatory Visit: Payer: BC Managed Care – PPO | Admitting: Cardiology

## 2022-07-12 ENCOUNTER — Telehealth: Payer: Self-pay | Admitting: Pharmacist

## 2022-07-12 ENCOUNTER — Ambulatory Visit (INDEPENDENT_AMBULATORY_CARE_PROVIDER_SITE_OTHER): Payer: BC Managed Care – PPO | Admitting: Student

## 2022-07-12 ENCOUNTER — Ambulatory Visit: Payer: BC Managed Care – PPO | Attending: Cardiology | Admitting: Cardiology

## 2022-07-12 ENCOUNTER — Ambulatory Visit: Payer: BC Managed Care – PPO | Attending: Cardiology

## 2022-07-12 ENCOUNTER — Encounter: Payer: Self-pay | Admitting: Cardiology

## 2022-07-12 VITALS — BP 124/76 | HR 61 | Ht 72.0 in | Wt 268.0 lb

## 2022-07-12 DIAGNOSIS — Z79899 Other long term (current) drug therapy: Secondary | ICD-10-CM

## 2022-07-12 DIAGNOSIS — I2510 Atherosclerotic heart disease of native coronary artery without angina pectoris: Secondary | ICD-10-CM | POA: Diagnosis not present

## 2022-07-12 DIAGNOSIS — E785 Hyperlipidemia, unspecified: Secondary | ICD-10-CM

## 2022-07-12 DIAGNOSIS — E7800 Pure hypercholesterolemia, unspecified: Secondary | ICD-10-CM

## 2022-07-12 DIAGNOSIS — I25118 Atherosclerotic heart disease of native coronary artery with other forms of angina pectoris: Secondary | ICD-10-CM

## 2022-07-12 DIAGNOSIS — Z8249 Family history of ischemic heart disease and other diseases of the circulatory system: Secondary | ICD-10-CM

## 2022-07-12 DIAGNOSIS — G4733 Obstructive sleep apnea (adult) (pediatric): Secondary | ICD-10-CM

## 2022-07-12 MED ORDER — PRALUENT 75 MG/ML ~~LOC~~ SOAJ: 75.0000 mg | SUBCUTANEOUS | 3 refills | Status: DC

## 2022-07-12 NOTE — Assessment & Plan Note (Signed)
Assessment:  LDLc elevated- on maximum tolerated statin (rosuvastatin 40 mg) Patient have muscle aches at 40 mg rosuvastatin dose Discussed next step medications to lower LDL (PCSK9i, ezetimibe and Leqvio) its effectiveness, cost and potential side effects  PCSK9i would be reasonable next option to achieve goal LDLc  (<70 mg/dl) Plan:  Continue taking rosuvastatin if 40 mg not tolerated try to take 20 mg daily or 40 mg every other day Will apply for PA for insurance preferred PCSK9i. If cost is an issue will consider adding ezetimibe to the the max tolerated statin  Will update patient via MyChart or phone for PA outcome

## 2022-07-12 NOTE — Progress Notes (Signed)
Patient ID: Wesley Wise                 DOB: [birth year]                    MRN: [id number]      HPI: Wesley Wise is a 61 y.o. male patient referred to lipid clinic by Dr.Pemberton. PMH is significant for  hyperlipidemia, coronary calcification on CT scan, and family history of CAD.   Coronary calcium score of 171. This was 28 percentile for age and sex matched control.   Patient presented today with his wife. Wanted to know all next options for cholesterol medications. He has mild- moderate muscle pain from 40 mg rosuvastatin dose. Him and his wife use to go for regular 1 hour walk but has not been doing lately. Diet is healthy home cooked meal not lot of fried food or process food. We discussed next step medications to lower LDL (PCSK9i, ezetimibe and Leqvio) its effectiveness, cost and potential side effects Patient has high deductible plan so little concern about injectable lipid lowering agents. Advise to use co- pay card.  Current Medications: rosuvastatin 40 mg  Risk Factors: high CAC score, family hx of CAD LDL goal: <70 mg/dl  Diet: healthy home cooked meals (lots of greens and chicken, fish- grilled)   Exercise: not doing any encourage to start doing regular activity 30-45 min walks most days of the week. Wife is thinking to start doing yoga for both of them.   Family History: The patient's family history includes CAD in his brother; Heart Problems (age of onset: 51) in his brother; Heart attack (age of onset: 64) in his father.    Labs: Lipid Panel     Component Value Date/Time   CHOL 175 04/18/2021 1041   CHOL 161 09/02/2014 1541   TRIG 94 04/18/2021 1041   TRIG 76 06/24/2017 1440   TRIG 182 (H) 09/02/2014 1541   HDL 33 (L) 04/18/2021 1041   HDL 33 (L) 06/24/2017 1440   HDL 28 (L) 09/02/2014 1541   CHOLHDL 5.3 (H) 04/18/2021 1041   VLDL 13 10/30/2015 0904   LDLCALC 125 (H) 04/18/2021 1041   LABVLDL 17 04/18/2021 1041    Past Medical History:  Diagnosis Date    BPH (benign prostatic hyperplasia)    HOH (hard of hearing)    hearing aid to left ear   Pure hypercholesterolemia     Current Outpatient Medications on File Prior to Visit  Medication Sig Dispense Refill   aspirin 81 MG tablet Take 81 mg by mouth daily.     rosuvastatin (CRESTOR) 40 MG tablet TAKE 1 TABLET BY MOUTH EVERY DAY 90 tablet 3   No current facility-administered medications on file prior to visit.    No Known Allergies   Pure hypercholesterolemia Assessment:  LDLc elevated- on maximum tolerated statin (rosuvastatin 40 mg) Patient have muscle aches at 40 mg rosuvastatin dose Discussed next step medications to lower LDL (PCSK9i, ezetimibe and Leqvio) its effectiveness, cost and potential side effects  PCSK9i would be reasonable next option to achieve goal LDLc  (<70 mg/dl) Plan:  Continue taking rosuvastatin if 40 mg not tolerated try to take 20 mg daily or 40 mg every other day Will apply for PA for insurance preferred PCSK9i. If cost is an issue will consider adding ezetimibe to the the max tolerated statin  Will update patient via MyChart or phone for PA outcome    Thank you,  Cammy Copa, Pharm.D [facility] of [facility] [address], [place], [place] [address]  Phone: [phone]; Fax: [phone]

## 2022-07-12 NOTE — Progress Notes (Signed)
Cardiology Office Note:    Date:  07/12/2022   ID:  Wesley Wise, DOB June 14, 1961, MRN [id number]  PCP:  Aletha Halim PA-C   [facility] Providers Cardiologist:  Freada Bergeron, MD {  Referring MD: Aletha Halim., PA-C    History of Present Illness:    Wesley Wise is a 61 y.o. male with a hx of hyperlipidemia, coronary calcification on CT scan, and family history of CAD who presents to clinic for follow-up.  Per review of the record, the patient has a significant family history of CAD with multiple siblings having had MIs.  His father and 2 brothers with CABG, sister with stents, and 3 older brothers without cardiac history.  Ca score in 2017 was 171 and nuclear stress test at that time which was negative for ischemia.  Was last seen by Laurann Montana, NP on 03/2021 where he was doing well from a CV standpoint.  Today, he is accompanied by a family member. He says he is doing well.   He states that he was diagnosed with mild sleep apnea. He has been using a CPAP and it has been helping him sleep better.   He reports some muscle aches with his statin. He endorses these muscle aches recede when he goes a day or two without taking his statin.    He denies any palpitations, chest pain, shortness of breath, or peripheral edema. No lightheadedness, headaches, syncope, orthopnea, or PND.   Past Medical History:  Diagnosis Date   BPH (benign prostatic hyperplasia)    HOH (hard of hearing)    hearing aid to left ear   Pure hypercholesterolemia     Past Surgical History:  Procedure Laterality Date   TRANSURETHRAL RESECTION OF PROSTATE N/A 08/23/2016   Procedure: TRANSURETHRAL RESECTION OF THE PROSTATE (TURP);  Surgeon: Kathie Rhodes, MD;  Location: [facility];  Service: Urology;  Laterality: N/A;    Current Medications: No outpatient medications have been marked as taking for the 07/12/22 encounter (Office Visit) with Freada Bergeron, MD.     Allergies:   Patient has no known allergies.   Social History   Socioeconomic History   Marital status: Married    Spouse name: Not on file   Number of children: Not on file   Years of education: Not on file   Highest education level: Not on file  Occupational History   Not on file  Tobacco Use   Smoking status: Former    Types: Cigarettes    Quit date: 08/20/2009    Years since quitting: 12.9   Smokeless tobacco: Never  Substance and Sexual Activity   Alcohol use: Yes    Alcohol/week: 10.0 - 20.0 standard drinks of alcohol    Types: 10 - 20 Cans of beer per week   Drug use: No   Sexual activity: Not on file  Other Topics Concern   Not on file  Social History Narrative   Not on file   Social Determinants of Health   Financial Resource Strain: Not on file  Food Insecurity: Not on file  Transportation Needs: Not on file  Physical Activity: Not on file  Stress: Not on file  Social Connections: Not on file     Family History: The patient's family history includes CAD in his brother; Heart Problems (age of onset: 45) in his brother; Heart attack (age of onset: 77) in his father.  ROS:   Please see the history of  present illness.    All other systems reviewed and are negative.  EKGs/Labs/Other Studies Reviewed:    The following studies were reviewed today:  Ca Score 2017: FINDINGS: Non-cardiac: No significant non cardiac findings on limited lung and soft tissue windows. See separate report from [facility].   Ascending Aorta:  Normal caliber.   Pericardium: Normal   Coronary arteries:  Normal origin.   IMPRESSION: Coronary calcium score of 171. This was 56 percentile for age and sex matched control.  EKG: EKG is personally reviewed. 07/12/22: Sinus rhythm. Rate 61 bpm. Poor R wave progression.    Recent Labs: No results found for requested labs within last 365 days.  Recent Lipid Panel    Component Value Date/Time    CHOL 175 04/18/2021 1041   CHOL 161 09/02/2014 1541   TRIG 94 04/18/2021 1041   TRIG 76 06/24/2017 1440   TRIG 182 (H) 09/02/2014 1541   HDL 33 (L) 04/18/2021 1041   HDL 33 (L) 06/24/2017 1440   HDL 28 (L) 09/02/2014 1541   CHOLHDL 5.3 (H) 04/18/2021 1041   CHOLHDL 3.7 10/30/2015 0904   VLDL 13 10/30/2015 0904   LDLCALC 125 (H) 04/18/2021 1041   LDLCALC 97 09/02/2014 1541     Risk Assessment/Calculations:                Physical Exam:    VS:  BP 124/76   Pulse 61   Ht 6' (1.829 m)   Wt 268 lb (121.6 kg)   SpO2 97%   BMI 36.35 kg/m     Wt Readings from Last 3 Encounters:  07/12/22 268 lb (121.6 kg)  04/18/21 260 lb (117.9 kg)  02/22/19 250 lb (113.4 kg)     GEN: Well nourished, well developed in no acute distress HEENT: Normal NECK: No JVD; No carotid bruits CARDIAC: RRR, no murmurs, rubs, gallops RESPIRATORY:  Clear to auscultation without rales, wheezing or rhonchi  ABDOMEN: Soft, non-tender, non-distended MUSCULOSKELETAL:  No edema; No deformity  SKIN: Warm and dry NEUROLOGIC:  Alert and oriented x 3 PSYCHIATRIC:  Normal affect   ASSESSMENT:    1. Coronary artery disease involving native coronary artery of native heart without angina pectoris   2. Hyperlipidemia LDL goal <70   3. Family history of early CAD   48. Pure hypercholesterolemia   5. Medication management   6. Hyperlipidemia, unspecified hyperlipidemia type   7. OSA on CPAP     PLAN:    In order of problems listed above:  #Elevated Ca Score: #CAD: Ca score elevated at 171 in 2017. Myoview negative for ischemia. Currently doing well without anginal symptoms.  -Continue crestor '40mg'$  daily -Continue ASA '81mg'$  daily  #HLD: LDL 125 on last check. Having mild myalgias with crestor and is interested in seeing if he can lower his dose to improve symptoms. Given LDL was 125 on max dose crestor, will refer to [facility] as he likely will qualify for PCSK9i to reach target. -Refer to [place]  [facility] -Continue crestor '40mg'$  daily for now; will likely need PCSK9i as having myalgias on max dose statin and is still not at LDL goal -Follow-up repeat lipids -Goal LDL<70  #OSA: Sleep study with mild OSA now on CPAP. Now with significantly improved symptoms with less daytime somnolence/fatigue and better sleep quality. Clearly benefiting from CPAP therapy. Will continue. -Continue CPAP         Follow up: 1 year.   Medication Adjustments/Labs and Tests Ordered: Current medicines are reviewed  at length with the patient today.  Concerns regarding medicines are outlined above.  Orders Placed This Encounter  Procedures   AMB Referral to [facility]   EKG 12-Lead   No orders of the defined types were placed in this encounter.   Patient Instructions  Medication Instructions:   Your physician recommends that you continue on your current medications as directed. Please refer to the Current Medication list given to you today.  *If you need a refill on your cardiac medications before your next appointment, please call your pharmacy*   You have been referred to [place]: At [facility], you and your health needs are our priority.  As part of our continuing mission to provide you with exceptional heart care, we have created designated Provider Care Teams.  These Care Teams include your primary Cardiologist (physician) and Advanced Practice Providers (APPs -  Physician Assistants and Nurse Practitioners) who all work together to provide you with the care you need, when you need it.  We recommend signing up for the patient portal called "MyChart".  Sign up information is provided on this After Visit Summary.  MyChart is used to connect with patients for Virtual Visits (Telemedicine).  Patients are able to view lab/test results, encounter notes, upcoming appointments, etc.  Non-urgent messages  can be sent to your provider as well.   To learn more about what you can do with MyChart, go to [email or web address].    Your next appointment:   1 month(s)  The format for your next appointment:   In Person  Provider:   Freada Bergeron, MD      Important Information About Sugar          I,Breanna Adamick,acting as a scribe for Freada Bergeron, MD.,have documented all relevant documentation on the behalf of Freada Bergeron, MD,as directed by  Freada Bergeron, MD while in the presence of Freada Bergeron, MD.  I, Freada Bergeron, MD, have reviewed all documentation for this visit. The documentation on 07/12/22 for the exam, diagnosis, procedures, and orders are all accurate and complete.   Signed, Freada Bergeron, MD  07/12/2022 8:44 AM    [place]

## 2022-07-12 NOTE — Telephone Encounter (Signed)
Applied for [employer]  Key: [id number] Waiting for determination

## 2022-07-12 NOTE — Patient Instructions (Addendum)
  Praluent is a cholesterol medication that improved your body's ability to get rid of "bad cholesterol" known as LDL. It can lower your LDL up to 60%. It is an injection that is given under the skin every 2 weeks. The most common side effects of Praluent include runny nose, symptoms of the common cold, rarely flu or flu-like symptoms, back/muscle pain in about 3-4% of the patients, and redness, pain, or bruising at the injection site.  The medication often requires a prior authorization from your insurance company. We will take care of submitting all the necessary information to your insurance company to get it approved.I will submit a prior authorization for Praluent . I will call you once I hear back.Please call me at [phone] with any questions.    [email or web address]

## 2022-07-12 NOTE — Patient Instructions (Addendum)
Medication Instructions:   Your physician recommends that you continue on your current medications as directed. Please refer to the Current Medication list given to you today.  *If you need a refill on your cardiac medications before your next appointment, please call your pharmacy*   You have been referred to [place]: At [facility], you and your health needs are our priority.  As part of our continuing mission to provide you with exceptional heart care, we have created designated Provider Care Teams.  These Care Teams include your primary Cardiologist (physician) and Advanced Practice Providers (APPs -  Physician Assistants and Nurse Practitioners) who all work together to provide you with the care you need, when you need it.  We recommend signing up for the patient portal called "MyChart".  Sign up information is provided on this After Visit Summary.  MyChart is used to connect with patients for Virtual Visits (Telemedicine).  Patients are able to view lab/test results, encounter notes, upcoming appointments, etc.  Non-urgent messages can be sent to your provider as well.   To learn more about what you can do with MyChart, go to [email or web address].    Your next appointment:   1 YEAR   The format for your next appointment:   In Person  Provider:   Freada Bergeron, MD      Important Information About Sugar

## 2022-07-13 LAB — CBC WITH DIFFERENTIAL/PLATELET
Basophils Absolute: 0.1 10*3/uL (ref 0.0–0.2)
Basos: 1 %
EOS (ABSOLUTE): 0.2 10*3/uL (ref 0.0–0.4)
Eos: 4 %
Hematocrit: 50.8 % (ref 37.5–51.0)
Hemoglobin: 16.5 g/dL (ref 13.0–17.7)
Immature Grans (Abs): 0 10*3/uL (ref 0.0–0.1)
Immature Granulocytes: 1 %
Lymphocytes Absolute: 1.9 10*3/uL (ref 0.7–3.1)
Lymphs: 34 %
MCH: 28.4 pg (ref 26.6–33.0)
MCHC: 32.5 g/dL (ref 31.5–35.7)
MCV: 88 fL (ref 79–97)
Monocytes Absolute: 0.5 10*3/uL (ref 0.1–0.9)
Monocytes: 9 %
Neutrophils Absolute: 2.8 10*3/uL (ref 1.4–7.0)
Neutrophils: 51 %
Platelets: 188 10*3/uL (ref 150–450)
RBC: 5.8 x10E6/uL (ref 4.14–5.80)
RDW: 12.9 % (ref 11.6–15.4)
WBC: 5.5 10*3/uL (ref 3.4–10.8)

## 2022-07-13 LAB — LIPID PANEL
Chol/HDL Ratio: 3.8 ratio (ref 0.0–5.0)
Cholesterol, Total: 140 mg/dL (ref 100–199)
HDL: 37 mg/dL — ABNORMAL LOW (ref >39)
LDL Chol Calc (NIH): 87 mg/dL (ref 0–99)
Triglycerides: 80 mg/dL (ref 0–149)
VLDL Cholesterol Cal: 16 mg/dL (ref 5–40)

## 2022-07-13 LAB — COMPREHENSIVE METABOLIC PANEL
ALT: 27 IU/L (ref 0–44)
AST: 23 IU/L (ref 0–40)
Albumin/Globulin Ratio: 2.4 — ABNORMAL HIGH (ref 1.2–2.2)
Albumin: 4.4 g/dL (ref 3.9–4.9)
Alkaline Phosphatase: 55 IU/L (ref 44–121)
BUN/Creatinine Ratio: 14 (ref 10–24)
BUN: 13 mg/dL (ref 8–27)
Bilirubin Total: 0.5 mg/dL (ref 0.0–1.2)
CO2: 25 mmol/L (ref 20–29)
Calcium: 9 mg/dL (ref 8.6–10.2)
Chloride: 107 mmol/L — ABNORMAL HIGH (ref 96–106)
Creatinine, Ser: 0.94 mg/dL (ref 0.76–1.27)
Globulin, Total: 1.8 g/dL (ref 1.5–4.5)
Glucose: 97 mg/dL (ref 70–99)
Potassium: 4.8 mmol/L (ref 3.5–5.2)
Sodium: 144 mmol/L (ref 134–144)
Total Protein: 6.2 g/dL (ref 6.0–8.5)
eGFR: 92 mL/min/{1.73_m2} (ref >59)

## 2022-07-15 NOTE — Telephone Encounter (Signed)
Insurance preferred [employer] is Investment banker, corporate for [employer] (Key: [id number])

## 2022-07-17 MED ORDER — REPATHA SURECLICK 140 MG/ML ~~LOC~~ SOAJ: 140.0000 mg | SUBCUTANEOUS | 11 refills | Status: DC

## 2022-07-17 NOTE — Addendum Note (Signed)
Addended by: Anda Latina on: 07/17/2022 09:09 AM   Modules accepted: Orders

## 2022-07-17 NOTE — Telephone Encounter (Signed)
Call to inform patient N/A Repatha PA request is approved from 07/15/2022 to 07/16/2023 Will informed via MyChart. Repatha prescription has been sent to the pharmacy ( for 1 month RX11)

## 2022-07-26 ENCOUNTER — Encounter: Payer: Self-pay | Admitting: Cardiology

## 2022-07-26 NOTE — Telephone Encounter (Signed)
E: Medical authorization Received: Today Freada Bergeron, MD  Nuala Alpha, LPN You are the best. Thank you!

## 2022-10-12 ENCOUNTER — Encounter: Payer: Self-pay | Admitting: Cardiology

## 2023-06-09 ENCOUNTER — Other Ambulatory Visit: Payer: Self-pay | Admitting: Pharmacist

## 2023-06-09 MED ORDER — REPATHA SURECLICK 140 MG/ML ~~LOC~~ SOAJ: 140.0000 mg | SUBCUTANEOUS | 11 refills | Status: DC

## 2024-07-02 ENCOUNTER — Telehealth: Payer: Self-pay | Admitting: Student in an Organized Health Care Education/Training Program

## 2024-07-02 DIAGNOSIS — E785 Hyperlipidemia, unspecified: Secondary | ICD-10-CM

## 2024-07-02 DIAGNOSIS — Z8249 Family history of ischemic heart disease and other diseases of the circulatory system: Secondary | ICD-10-CM

## 2024-07-02 DIAGNOSIS — E7800 Pure hypercholesterolemia, unspecified: Secondary | ICD-10-CM

## 2024-07-02 NOTE — Telephone Encounter (Signed)
 Pt c/o medication issue:  1. Name of Medication:   Evolocumab  (REPATHA  SURECLICK) 140 MG/ML SOAJ    2. How are you currently taking this medication (dosage and times per day)? As written  3. Are you having a reaction (difficulty breathing--STAT)? No  4. What is your medication issue? Pt needing refill on medication     *STAT* If patient is at the pharmacy, call can be transferred to refill team.   1. Which medications need to be refilled? (please list name of each medication and dose if known) rosuvastatin  (CRESTOR ) 40 MG tablet    2. Would you like to learn more about the convenience, safety, & potential cost savings by using the [facility]? No   3. Are you open to using the [facility] (Type [facility] ) No   4. Which pharmacy/location (including street and city if local pharmacy) is medication to be sent to?  [id number] - [place], [place] - [address] [place] [place]    5. Do they need a 30 day or 90 day supply? 90 day  Pt has scheduled appt on 11/21

## 2024-07-04 MED ORDER — REPATHA SURECLICK 140 MG/ML ~~LOC~~ SOAJ: 140.0000 mg | SUBCUTANEOUS | 0 refills | Status: DC

## 2024-07-04 MED ORDER — ROSUVASTATIN CALCIUM 40 MG PO TABS: 40.0000 mg | ORAL_TABLET | Freq: Every day | ORAL | 0 refills | Status: AC

## 2024-07-12 ENCOUNTER — Telehealth: Payer: Self-pay | Admitting: Student in an Organized Health Care Education/Training Program

## 2024-07-12 NOTE — Telephone Encounter (Signed)
 Patient calling to see if he needs labs done prior to his appt. Please advise

## 2024-07-13 NOTE — Telephone Encounter (Signed)
 I called the patient and let him know of Dr. Ruthanne response to his question about having lab work done before coming in for his appointment on 07/16/24. Dr. Floretta stated that he does not need any lab work done because this would be the first time he will be seeing the patient. He wants to see him before ordering any tests or labs. I let the patient know of this response and he was appreciative of the call back.

## 2024-07-16 ENCOUNTER — Ambulatory Visit
Attending: Student in an Organized Health Care Education/Training Program | Admitting: Student in an Organized Health Care Education/Training Program

## 2024-07-16 ENCOUNTER — Encounter: Payer: Self-pay | Admitting: Student in an Organized Health Care Education/Training Program

## 2024-07-16 VITALS — BP 134/82 | HR 63 | Ht 72.0 in | Wt 263.8 lb

## 2024-07-16 DIAGNOSIS — I444 Left anterior fascicular block: Secondary | ICD-10-CM | POA: Diagnosis not present

## 2024-07-16 DIAGNOSIS — R931 Abnormal findings on diagnostic imaging of heart and coronary circulation: Secondary | ICD-10-CM | POA: Diagnosis not present

## 2024-07-16 DIAGNOSIS — Z8249 Family history of ischemic heart disease and other diseases of the circulatory system: Secondary | ICD-10-CM | POA: Diagnosis not present

## 2024-07-16 DIAGNOSIS — E782 Mixed hyperlipidemia: Secondary | ICD-10-CM | POA: Diagnosis not present

## 2024-07-16 NOTE — Patient Instructions (Addendum)
 Medication Instructions:   Your physician recommends that you continue on your current medications as directed. Please refer to the Current Medication list given to you today.   *If you need a refill on your cardiac medications before your next appointment, please call your pharmacy*  Lab Work: LIPID Panel today  If you have labs (blood work) drawn today and your tests are completely normal, you will receive your results only by: MyChart Message (if you have MyChart) OR A paper copy in the mail  If you have any lab test that is abnormal or we need to change your treatment, we will call you to review the results.  Testing/Procedures: ECHOCARDIOGRAM  Your physician has requested that you have an echocardiogram. Echocardiography is a painless test that uses sound waves to create images of your heart. It provides your doctor with information about the size and shape of your heart and how well your heart's chambers and valves are working. This procedure takes approximately one hour. There are no restrictions for this procedure. Please do NOT wear cologne, perfume, aftershave, or lotions (deodorant is allowed). Please arrive 15 minutes prior to your appointment time.  Please note: We ask at that you not bring children with you during ultrasound (echo/ vascular) testing. Due to room size and safety concerns, children are not allowed in the ultrasound rooms during exams. Our front office staff cannot provide observation of children in our lobby area while testing is being conducted. An adult accompanying a patient to their appointment will only be allowed in the ultrasound room at the discretion of the ultrasound technician under special circumstances. We apologize for any inconvenience.   Follow-Up: At [facility], you and your health needs are our priority.  As part of our continuing mission to provide you with exceptional heart care, our providers are all part of one team.  This team  includes your primary Cardiologist (physician) and Advanced Practice Providers or APPs (Physician Assistants and Nurse Practitioners) who all work together to provide you with the care you need, when you need it.  Your next appointment:   1 year(s)  Provider:   Georganna Archer, MD

## 2024-07-16 NOTE — Progress Notes (Signed)
 Cardiology Office Note:   Date:  07/16/2024  ID:  Wesley Wise, DOB 02-28-1961, MRN [id number] PCP: Debrah Josette MOHR PA-C  [facility] Providers Cardiologist:  Georganna Archer, MD { Chief Complaint:  Chief Complaint  Patient presents with   Follow-up      History of Present Illness:   Wesley Wise is a 63 y.o. male with a PMH of HLD, CAC, OSA who presents for follow up.  Patient presents today for follow-up.  He has no complaints or concerns.  Overall he says he feels well.  He is taking all of his medications as prescribed without side effect.  He missed 1 week of his Repatha  as he was pending a refill, but since it is restarted.  He says that he is not as physically active or eating as well as he would like, but is eager to get restarted.  No symptoms  Past Medical History:  Diagnosis Date   BPH (benign prostatic hyperplasia)    HOH (hard of hearing)    hearing aid to left ear   Pure hypercholesterolemia      Studies Reviewed:    EKG:  EKG Interpretation Date/Time:  Friday July 16 2024 15:05:46 EST Ventricular Rate:  63 PR Interval:  192 QRS Duration:  128 QT Interval:  424 QTC Calculation: 433 R Axis:   -75  Text Interpretation: Normal sinus rhythm Right bundle branch block Left anterior fascicular block Bifascicular block No previous ECGs available Confirmed by Archer Georganna [phone]) on 07/16/2024 3:10:01 PM     Cardiac Studies & Procedures   ______________________________________________________________________________________________          CT SCANS  CT CARDIAC SCORING (SELF PAY ONLY) 10/12/2014  Addendum 10/12/2014 11:21 AM ADDENDUM REPORT: 10/12/2014 11:19  CLINICAL DATA:  Risk stratification  EXAM: Coronary Calcium  Score  TECHNIQUE: The patient was scanned on a Siemens Sensation 16 slice scanner. Axial non-contrast 3mm slices were carried out through the heart. The data set was analyzed on a dedicated work station and  scored using the Agatson method.  FINDINGS: Non-cardiac: No significant non cardiac findings on limited lung and soft tissue windows. See separate report from [facility].  Ascending Aorta:  Normal caliber.  Pericardium: Normal  Coronary arteries:  Normal origin.  IMPRESSION: Coronary calcium  score of 171. This was 78 percentile for age and sex matched control.  Leim Moose   Electronically Signed By: Leim Moose On: 10/12/2014 11:19  Narrative EXAM: OVER-READ INTERPRETATION  CT CHEST  The following report is an over-read performed by radiologist Dr. Toribio Cove [facility] on 10/12/2014. This over-read does not include interpretation of cardiac or coronary anatomy or pathology. The calcium  score interpretation by the cardiologist is attached.  COMPARISON:  No priors.  FINDINGS: Within the visualized portions of the thorax there are no suspicious appearing pulmonary nodules or masses, there is no acute consolidative airspace disease, no pleural effusion, no pneumothorax and no mediastinal or hilar lymphadenopathy. Visualized portions of the upper abdomen are unremarkable. There are no aggressive appearing lytic or blastic lesions noted in the visualized portions of the skeleton.  IMPRESSION: 1. No significant incidental noncardiac findings noted.  Electronically Signed: By: Wesley Aye M.D. On: 10/12/2014 08:56     ______________________________________________________________________________________________      Risk Assessment/Calculations:              Physical Exam:     VS:  BP 134/82   Pulse 63   Ht 6' (1.829 m)  Wt 263 lb 12.8 oz (119.7 kg)   SpO2 99%   BMI 35.78 kg/m      Wt Readings from Last 3 Encounters:  07/12/22 268 lb (121.6 kg)  04/18/21 260 lb (117.9 kg)  02/22/19 250 lb (113.4 kg)     GEN: Well nourished, well developed, in no acute distress NECK: No JVD; No carotid  bruits CARDIAC: RRR, no murmurs, rubs, gallops RESPIRATORY:  Clear to auscultation without rales, wheezing or rhonchi  ABDOMEN: Soft, non-tender, non-distended, normal bowel sounds EXTREMITIES:  Warm and well perfused, no edema; No deformity, 2+ radial pulses PSYCH: Normal mood and affect   Assessment & Plan Elevated coronary artery calcium  score - Elevated calcium  score to 171. -No symptoms. Continue baby aspirin indefinitely Follow-up in 12 months Mixed hyperlipidemia - Goal LDL <70. - Will check today. 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 -No symptoms; however, this can be seen in the setting of structural heart disease. -Had a negative stress test in 2016 and no current symptoms. -Will plan to do a complete echocardiogram as this has never been performed. Complete echocardiogram Annual ECG          This note was written with the assistance of a dictation microphone or AI dictation software. Please excuse any typos or grammatical errors.   Signed, Georganna Archer, MD 07/16/2024 12:43 PM    [facility]

## 2024-07-17 LAB — LIPID PANEL
Chol/HDL Ratio: 2.1 ratio (ref 0.0–5.0)
Cholesterol, Total: 75 mg/dL — ABNORMAL LOW (ref 100–199)
HDL: 35 mg/dL — ABNORMAL LOW (ref >39)
LDL Chol Calc (NIH): 25 mg/dL (ref 0–99)
Triglycerides: 65 mg/dL (ref 0–149)
VLDL Cholesterol Cal: 15 mg/dL (ref 5–40)

## 2024-07-19 ENCOUNTER — Telehealth (HOSPITAL_COMMUNITY): Payer: Self-pay | Admitting: Student in an Organized Health Care Education/Training Program

## 2024-07-19 ENCOUNTER — Ambulatory Visit: Payer: Self-pay | Admitting: Student in an Organized Health Care Education/Training Program

## 2024-07-19 NOTE — Telephone Encounter (Signed)
 fyi

## 2024-07-19 NOTE — Telephone Encounter (Signed)
 Patient called and cancelled echocardiogram and does not wish to reschedule. Order will be removed from the echo WQ.

## 2024-08-06 MED ORDER — REPATHA SURECLICK 140 MG/ML ~~LOC~~ SOAJ: 140.0000 mg | SUBCUTANEOUS | 11 refills | Status: AC

## 2024-09-03 ENCOUNTER — Ambulatory Visit (HOSPITAL_COMMUNITY)
